# Patient Record
Sex: Female | Born: 1985 | Race: White | Hispanic: No | Marital: Married | Smoking: Never smoker
Health system: Southern US, Community
[De-identification: ages and names within clinical notes are randomized; demographics above are authoritative.]

## PROBLEM LIST (undated history)

## (undated) DIAGNOSIS — Z8619 Personal history of other infectious and parasitic diseases: Secondary | ICD-10-CM

## (undated) DIAGNOSIS — Z789 Other specified health status: Secondary | ICD-10-CM

## (undated) DIAGNOSIS — N390 Urinary tract infection, site not specified: Secondary | ICD-10-CM

## (undated) DIAGNOSIS — A630 Anogenital (venereal) warts: Secondary | ICD-10-CM

## (undated) DIAGNOSIS — O0930 Supervision of pregnancy with insufficient antenatal care, unspecified trimester: Secondary | ICD-10-CM

## (undated) HISTORY — PX: TYMPANOSTOMY TUBE PLACEMENT: SHX32

## (undated) HISTORY — PX: NO PAST SURGERIES: SHX2092

## (undated) HISTORY — PX: MYRINGOTOMY: SUR874

## (undated) HISTORY — DX: Anogenital (venereal) warts: A63.0

## (undated) HISTORY — DX: Supervision of pregnancy with insufficient antenatal care, unspecified trimester: O09.30

## (undated) HISTORY — DX: Personal history of other infectious and parasitic diseases: Z86.19

---

## 2008-01-27 ENCOUNTER — Emergency Department (HOSPITAL_COMMUNITY): Admission: EM | Admit: 2008-01-27 | Discharge: 2008-01-28 | Payer: Self-pay | Admitting: *Deleted

## 2008-10-21 ENCOUNTER — Other Ambulatory Visit: Admission: RE | Admit: 2008-10-21 | Discharge: 2008-10-21 | Payer: Self-pay | Admitting: Obstetrics and Gynecology

## 2009-04-11 ENCOUNTER — Ambulatory Visit: Payer: Self-pay | Admitting: Advanced Practice Midwife

## 2009-04-11 ENCOUNTER — Inpatient Hospital Stay (HOSPITAL_COMMUNITY): Admission: AD | Admit: 2009-04-11 | Discharge: 2009-04-11 | Payer: Self-pay | Admitting: Obstetrics & Gynecology

## 2009-05-07 ENCOUNTER — Inpatient Hospital Stay (HOSPITAL_COMMUNITY): Admission: AD | Admit: 2009-05-07 | Discharge: 2009-05-08 | Payer: Self-pay | Admitting: Obstetrics and Gynecology

## 2009-05-07 ENCOUNTER — Ambulatory Visit: Payer: Self-pay | Admitting: Obstetrics and Gynecology

## 2009-05-17 ENCOUNTER — Inpatient Hospital Stay (HOSPITAL_COMMUNITY): Admission: AD | Admit: 2009-05-17 | Discharge: 2009-05-17 | Payer: Self-pay | Admitting: Family Medicine

## 2009-05-17 ENCOUNTER — Ambulatory Visit: Payer: Self-pay | Admitting: Obstetrics and Gynecology

## 2009-05-18 DIAGNOSIS — K219 Gastro-esophageal reflux disease without esophagitis: Secondary | ICD-10-CM | POA: Insufficient documentation

## 2009-05-20 ENCOUNTER — Inpatient Hospital Stay (HOSPITAL_COMMUNITY): Admission: AD | Admit: 2009-05-20 | Discharge: 2009-05-23 | Payer: Self-pay | Admitting: Obstetrics & Gynecology

## 2009-05-20 ENCOUNTER — Encounter (INDEPENDENT_AMBULATORY_CARE_PROVIDER_SITE_OTHER): Payer: Self-pay | Admitting: *Deleted

## 2009-05-20 ENCOUNTER — Ambulatory Visit: Payer: Self-pay | Admitting: Family

## 2009-05-24 ENCOUNTER — Ambulatory Visit: Payer: Self-pay | Admitting: Advanced Practice Midwife

## 2009-05-24 ENCOUNTER — Inpatient Hospital Stay (HOSPITAL_COMMUNITY): Admission: AD | Admit: 2009-05-24 | Discharge: 2009-05-24 | Payer: Self-pay | Admitting: Obstetrics & Gynecology

## 2010-03-30 NOTE — Letter (Signed)
Summary: Appointment - Missed  [facility] [place]  [address], [place] [address]   Phone: [phone]  Fax: [phone]     May 20, 2009 MRN: [id number]   Jordan Ford [address] [place], [place]  [address]   Dear Ms. MABE,  Our records indicate you missed your appointment on   05/20/09  with Dr.    Diona Browner     It is very important that we reach you to reschedule this appointment. We look forward to participating in your health care needs. Please contact us at the number listed above at your earliest convenience to reschedule this appointment.     Sincerely,    Glass blower/designer

## 2010-05-20 LAB — URINALYSIS, ROUTINE W REFLEX MICROSCOPIC
Bilirubin Urine: NEGATIVE
Glucose, UA: NEGATIVE mg/dL
Hgb urine dipstick: NEGATIVE
Ketones, ur: NEGATIVE mg/dL
Nitrite: NEGATIVE
Protein, ur: NEGATIVE mg/dL
Specific Gravity, Urine: 1.015 (ref 1.005–1.030)
Urobilinogen, UA: 0.2 mg/dL (ref 0.0–1.0)
pH: 6 (ref 5.0–8.0)

## 2010-05-24 LAB — URINALYSIS, ROUTINE W REFLEX MICROSCOPIC
Bilirubin Urine: NEGATIVE
Glucose, UA: NEGATIVE mg/dL
Hgb urine dipstick: NEGATIVE
Ketones, ur: 15 mg/dL — AB
Nitrite: NEGATIVE
Protein, ur: NEGATIVE mg/dL
Specific Gravity, Urine: 1.015 (ref 1.005–1.030)
Urobilinogen, UA: 0.2 mg/dL (ref 0.0–1.0)
pH: 6.5 (ref 5.0–8.0)

## 2010-05-24 LAB — CBC
HCT: 37.9 % (ref 36.0–46.0)
Hemoglobin: 13.1 g/dL (ref 12.0–15.0)
MCHC: 34.7 g/dL (ref 30.0–36.0)
MCV: 90 fL (ref 78.0–100.0)
Platelets: 205 10*3/uL (ref 150–400)
RBC: 4.21 MIL/uL (ref 3.87–5.11)
RDW: 13.8 % (ref 11.5–15.5)
WBC: 19.5 10*3/uL — ABNORMAL HIGH (ref 4.0–10.5)

## 2010-05-24 LAB — RAPID URINE DRUG SCREEN, HOSP PERFORMED
Amphetamines: NOT DETECTED
Barbiturates: NOT DETECTED
Benzodiazepines: NOT DETECTED
Cocaine: NOT DETECTED
Opiates: NOT DETECTED
Tetrahydrocannabinol: NOT DETECTED

## 2010-05-24 LAB — URINE MICROSCOPIC-ADD ON

## 2010-05-24 LAB — RPR: RPR Ser Ql: NONREACTIVE

## 2010-05-24 LAB — TSH: TSH: 2.037 u[IU]/mL (ref 0.350–4.500)

## 2010-11-30 LAB — POCT PREGNANCY, URINE: Preg Test, Ur: NEGATIVE

## 2011-08-29 ENCOUNTER — Emergency Department (HOSPITAL_COMMUNITY)
Admission: EM | Admit: 2011-08-29 | Discharge: 2011-08-29 | Disposition: A | Discharge status: Home / Self Care | Payer: Medicaid Other | Attending: Emergency Medicine | Admitting: Emergency Medicine

## 2011-08-29 ENCOUNTER — Encounter (HOSPITAL_COMMUNITY): Payer: Self-pay | Admitting: *Deleted

## 2011-08-29 DIAGNOSIS — N390 Urinary tract infection, site not specified: Secondary | ICD-10-CM

## 2011-08-29 HISTORY — DX: Urinary tract infection, site not specified: N39.0

## 2011-08-29 LAB — URINE MICROSCOPIC-ADD ON

## 2011-08-29 LAB — URINALYSIS, ROUTINE W REFLEX MICROSCOPIC
Bilirubin Urine: NEGATIVE
Glucose, UA: NEGATIVE mg/dL
Ketones, ur: NEGATIVE mg/dL
Nitrite: NEGATIVE
Protein, ur: NEGATIVE mg/dL
Specific Gravity, Urine: 1.019 (ref 1.005–1.030)
Urobilinogen, UA: 0.2 mg/dL (ref 0.0–1.0)
pH: 6 (ref 5.0–8.0)

## 2011-08-29 LAB — PREGNANCY, URINE: Preg Test, Ur: NEGATIVE

## 2011-08-29 MED ORDER — CIPROFLOXACIN HCL 500 MG PO TABS: 500.0000 mg | ORAL_TABLET | Freq: Two times a day (BID) | ORAL | Status: AC

## 2011-08-29 NOTE — ED Provider Notes (Signed)
History    This chart was scribed for Flint Melter, MD, MD by Smitty Pluck. The patient was seen in room [facility] and the patient's care was started at 4:35PM.   CSN: [id number]  Arrival date & time 08/29/11  1456   None     Chief Complaint  Patient presents with  . Urinary Tract Infection    (Consider location/radiation/quality/duration/timing/severity/associated sxs/prior treatment) The history is provided by the patient.   Jordan Ford is a 26 y.o. female who presents to the Emergency Department complaining of moderate dysuria, lower back pain radiating to abdominal onset 1 week. Pt denies fever and vomiting. Pt reports the last time she had UTI was a couple of years ago. Pt reports symptoms have been constant. Pt reports vaginal discharge.   Past Medical History  Diagnosis Date  . UTI (urinary tract infection)     No past surgical history on file.  No family history on file.  History  Substance Use Topics  . Smoking status: Never Smoker   . Smokeless tobacco: Not on file  . Alcohol Use: No    OB History    Grav Para Term Preterm Abortions TAB SAB Ect Mult Living                  Review of Systems  Constitutional: Negative for fever and chills.  HENT: Negative for congestion and rhinorrhea.   Respiratory: Negative for cough and shortness of breath.   Gastrointestinal: Negative for nausea, vomiting and diarrhea.  Genitourinary: Positive for dysuria. Negative for frequency and pelvic pain.  Musculoskeletal: Positive for back pain.  All other systems reviewed and are negative.    Allergies  Amoxil and Penicillins  Home Medications   Current Outpatient Rx  Name Route Sig Dispense Refill  . DROSPIRENONE-ETHINYL ESTRADIOL 3-0.02 MG PO TABS Oral Take 1 tablet by mouth daily.    Marland Kitchen CIPROFLOXACIN HCL 500 MG PO TABS Oral Take 1 tablet (500 mg total) by mouth 2 (two) times daily. 14 tablet 0    BP 102/65  Pulse 77  Temp 98.1 F (36.7 C) (Oral)  Resp 16   SpO2 100%  LMP 08/29/2011  Physical Exam  Nursing note and vitals reviewed. Constitutional: She is oriented to person, place, and time. She appears well-developed and well-nourished.  HENT:  Head: Normocephalic and atraumatic.  Eyes: Conjunctivae are normal. Pupils are equal, round, and reactive to light.  Neck: Normal range of motion. Neck supple.  Cardiovascular: Normal rate, regular rhythm and normal heart sounds.   Pulmonary/Chest: Effort normal. No respiratory distress.  Abdominal: Soft. She exhibits no distension. There is no tenderness.  Genitourinary:       No costovertebral angle tenderness   Musculoskeletal: She exhibits no tenderness.  Neurological: She is alert and oriented to person, place, and time.  Skin: Skin is warm and dry.  Psychiatric: She has a normal mood and affect. Her behavior is normal.    ED Course  Procedures (including critical care time) DIAGNOSTIC STUDIES: Oxygen Saturation is 100% on room air, normal by my interpretation.    COORDINATION OF CARE: 4:44PM EDP discusses pt ED treatment with pt.  6:13PM EDP rechecks pt and discusses pt lab results.  Labs Reviewed  URINALYSIS, ROUTINE W REFLEX MICROSCOPIC - Abnormal; Notable for the following:    Hgb urine dipstick MODERATE (*)     Leukocytes, UA TRACE (*)     All other components within normal limits  URINE MICROSCOPIC-ADD ON - Abnormal; Notable  for the following:    Bacteria, UA FEW (*)     All other components within normal limits  PREGNANCY, URINE  URINE CULTURE   No results found.   1. UTI (lower urinary tract infection)       MDM  Duyuria and flank pain in non-toxic pt. No clear UTI on testing, culture sent. Doubt metabolic instability, serious bacterial infection or impending vascular collapse; the patient is stable for discharge.  Plan: Home Medications- Cipro; Home Treatments- rest and fluids; Recommended follow up- PCP of choice prn  I personally performed the services  described in this documentation, which was scribed in my presence. The recorded information has been reviewed and considered.         Flint Melter, MD 08/30/11 1021

## 2011-08-29 NOTE — ED Notes (Addendum)
Pt reports burning w/urination and (L) lower back pain x1 week. Pt denies N/V/D or abnormal vaginal odor or discharge. Pt denies dysuria or frequency

## 2011-08-29 NOTE — ED Notes (Signed)
Feels like she has an uti. Hx of uti with similar symptoms. Burning on urination.

## 2011-08-29 NOTE — Discharge Instructions (Signed)
There was no clear evidence for a urinary infection.  The culture will return in 2 days and will likely show an infection, if you actually have one. It is important to see a doctor if you are not better in 7-10 days.   Urinary Tract Infection Infections of the urinary tract can start in several places. A bladder infection (cystitis), a kidney infection (pyelonephritis), and a prostate infection (prostatitis) are different types of urinary tract infections (UTIs). They usually get better if treated with medicines (antibiotics) that kill germs. Take all the medicine until it is gone. You or your child may feel better in a few days, but TAKE ALL MEDICINE or the infection may not respond and may become more difficult to treat. HOME CARE INSTRUCTIONS   Drink enough water and fluids to keep the urine clear or pale yellow. Cranberry juice is especially recommended, in addition to large amounts of water.   Avoid caffeine, tea, and carbonated beverages. They tend to irritate the bladder.   Alcohol may irritate the prostate.   Only take over-the-counter or prescription medicines for pain, discomfort, or fever as directed by your caregiver.  To prevent further infections:  Empty the bladder often. Avoid holding urine for long periods of time.   After a bowel movement, women should cleanse from front to back. Use each tissue only once.   Empty the bladder before and after sexual intercourse.  FINDING OUT THE RESULTS OF YOUR TEST Not all test results are available during your visit. If your or your child's test results are not back during the visit, make an appointment with your caregiver to find out the results. Do not assume everything is normal if you have not heard from your caregiver or the medical facility. It is important for you to follow up on all test results. SEEK MEDICAL CARE IF:   There is back pain.   Your baby is older than 3 months with a rectal temperature of 100.5 F (38.1 C) or  higher for more than 1 day.   Your or your child's problems (symptoms) are no better in 3 days. Return sooner if you or your child is getting worse.  SEEK IMMEDIATE MEDICAL CARE IF:   There is severe back pain or lower abdominal pain.   You or your child develops chills.   You have a fever.   Your baby is older than 3 months with a rectal temperature of 102 F (38.9 C) or higher.   Your baby is 42 months old or younger with a rectal temperature of 100.4 F (38 C) or higher.   There is nausea or vomiting.   There is continued burning or discomfort with urination.  MAKE SURE YOU:   Understand these instructions.   Will watch your condition.   Will get help right away if you are not doing well or get worse.  Document Released: 11/24/2004 Document Revised: 02/03/2011 Document Reviewed: 06/29/2006 [facility] Patient Information 2012 [place], [place].  RESOURCE GUIDE  Chronic Pain Problems: Contact [facility]  [phone] Patients need to be referred by their primary care doctor.  Insufficient Money for Medicine: Contact United Way:  call "211" or [facility] (669)833-4255.  No Primary Care Doctor: - Call Health Connect  [phone] - can help you locate a primary care doctor that  accepts your insurance, provides certain services, etc. - Physician Referral [phone]  Agencies that provide inexpensive medical care: - [facility]  [phone] - [facility]  [phone] - [facility]  [phone] - Women's Clinic  [phone] - Planned Parenthood  [phone] [facility]  [phone]  Medicaid-accepting [facility] Providers: - [facility]- [address]  [phone], Mon-Fri 9am-7pm, Sat 9am-1pm - [facility]- [address] [place], [address] [place]  [phone] - [facility]- [address], [address] [place]  [phone] [facility]- [address]  [phone] - Renaye Rakers- [address] [place], [address], [phone]  Only accepts [place] patients after they have their name  applied to their card  Self Pay (no insurance) in [place]: - Sickle Cell Patients: Dr Willey Blade, [facility]  [address] [place], [phone] - [facility]- [address] [place]  [phone]       [facility] [place]- [address]       -     [facility]- see information above (Speak to [employer] if you do not have insurance)       -  [facility]- [address] [place], [phone]       -  [facility]- [address],  [phone]       -  [facility]- [address] [phone]       -  Dr Julio Sicks-  [address], [place], [phone]       -  [facility]gent Care- [address] [phone]       -  [facility]- [address], [phone], also [address] [phone]       -    [facility]- [address] [place], [phone], 1st & 3rd Saturday   every month, 10am-1pm  1) Find a Doctor and Pay Out of Pocket Although you won't have to find out who is covered by your insurance plan, it is a good idea to ask around and get recommendations. You will then need to call the office and see if the doctor you have chosen will accept you as a new patient and what types of options they offer for patients who are self-pay. Some doctors offer discounts or will set up payment plans for their patients who do not have insurance, but you will need to ask so you aren't surprised when you get to your appointment.  2) Contact Your Local Health Department Not all health departments have doctors that can see patients for sick visits, but many do, so it is worth a call to see if yours does. If you don't know where your local health department is, you can check in your phone book. The CDC  also has a tool to help you locate your state's health department, and many state websites also have listings of all of their local health departments.  3) Find a Walk-in Clinic If your illness is not likely to be very severe or complicated, you may want to try a walk in clinic. These are popping up all over the country in pharmacies, drugstores, and shopping centers. They're usually staffed by nurse practitioners or physician assistants that have been trained to treat common illnesses and complaints. They're usually fairly quick and inexpensive. However, if you have serious medical issues or chronic medical problems, these are probably not  your best option  STD Testing - [facility] Department of [facility] [place], STD Clinic, [address], [place], phone [phone] or [phone].  Monday - Friday, call for an appointment. [facility] Department of [employer], STD Clinic, [address], [place], phone [phone] or [phone].  Monday - Friday, call for an appointment.  Abuse/Neglect: [facility] Child Abuse Hotline 4131039190 [facility] Child Abuse Hotline 774-760-1127 (After Hours)  Emergency Shelter:  [facility] (854)289-9804  Maternity Homes: - [facility] (220)545-7521 - [facility] (331) [phone]  MRSA Hotline #:   718-299-1943  [facility] Resources  [facility]  United Way [facility] Dept. [address]                 [address]         [address] [place] [address]  Blondell Reveal Phone:  [phone]                                  Phone:  [phone]                   Phone:  [phone]  [facility] Mental Health, [phone] - [facility] - [phone]       -     [facility]  in [place], [address]                                  [phone], [facility] Child Abuse Hotline 262-619-1573 or 810-046-5248 (After Hours)   Behavioral Health Services  Substance Abuse Resources: - [facility] Drug Services  716-254-1698 - [facility] [phone] - [facility] [place] [phone] Floydene Flock [phone] - Residential & Outpatient Substance Abuse Program  [phone]  Psychological Services: [facility]  [phone] Services  [phone] - [facility], [phone] [place]. [address] [place], ACCESS LINE: 334-773-6605 or (442)268-5759, [employer]za  Dental Assistance  If unable to pay or uninsured, contact:  [facility] or [facility]. to become qualified for the adult dental clinic.  Patients with Medicaid: [facility] [phone], [phone] [address] [phone]  If unable to pay, or uninsured, contact [facility] (772)269-8013) or [facility] Department [phone])[phone] in [place], [phone] in [facility]) to become qualified for the adult dental clinic  Other Low-Cost Community Dental Services: - [facility]- [address] [place], [place], [place], [address], [phone], Ext. [address], 2nd and 4th Thursday of the month at 6:30am.  10 clients each day by appointment, can sometimes see walk-in patients if someone does not show for an appointment. [facility]- [address] [place], [place], [address], [phone] -  [facility]- [address], [place], [place], [address], [phone] - [place] [facility]- [phone] [facility] Health Department- [phone] [facility] Department- [phone]

## 2011-08-31 LAB — URINE CULTURE
Colony Count: NO GROWTH
Culture: NO GROWTH

## 2012-02-29 NOTE — L&D Delivery Note (Signed)
Pt progressed along a normal labor curve. She had some varible decels in the later part of the first stage. She pushed for one half hour and had a SVD of one live viable white female infant over a second degree midline tear in the ROA position. Placenta S/I. 3 vessel cord. [facility]. Tear closed with 30 chromic.

## 2012-04-09 LAB — OB RESULTS CONSOLE HIV ANTIBODY (ROUTINE TESTING): HIV: NONREACTIVE

## 2012-04-09 LAB — OB RESULTS CONSOLE ABO/RH: RH Type: NEGATIVE

## 2012-04-09 LAB — OB RESULTS CONSOLE RPR: RPR: NONREACTIVE

## 2012-04-09 LAB — OB RESULTS CONSOLE RUBELLA ANTIBODY, IGM: Rubella: IMMUNE

## 2012-04-09 LAB — OB RESULTS CONSOLE ANTIBODY SCREEN: Antibody Screen: NEGATIVE

## 2012-04-09 LAB — OB RESULTS CONSOLE HEPATITIS B SURFACE ANTIGEN: Hepatitis B Surface Ag: NEGATIVE

## 2012-04-09 LAB — OB RESULTS CONSOLE GC/CHLAMYDIA
Chlamydia: NEGATIVE
Gonorrhea: NEGATIVE

## 2012-08-17 LAB — OB RESULTS CONSOLE GBS: GBS: NEGATIVE

## 2012-08-25 ENCOUNTER — Encounter (HOSPITAL_COMMUNITY): Payer: Self-pay

## 2012-08-25 ENCOUNTER — Inpatient Hospital Stay (HOSPITAL_COMMUNITY)
Admission: AD | Admit: 2012-08-25 | Discharge: 2012-08-25 | Disposition: A | Discharge status: Home / Self Care | Payer: Medicaid Other | Source: Ambulatory Visit | Attending: Obstetrics and Gynecology | Admitting: Obstetrics and Gynecology

## 2012-08-25 DIAGNOSIS — O479 False labor, unspecified: Secondary | ICD-10-CM | POA: Insufficient documentation

## 2012-08-25 NOTE — MAU Note (Signed)
Patient is in with c/o ctx q3+4 minutes for 5 hours. She denies vaginal bleeding or lof. She reports good fetal movement

## 2012-09-06 ENCOUNTER — Telehealth (HOSPITAL_COMMUNITY): Payer: Self-pay | Admitting: *Deleted

## 2012-09-06 ENCOUNTER — Inpatient Hospital Stay (HOSPITAL_COMMUNITY)
Admission: AD | Admit: 2012-09-06 | Discharge: 2012-09-09 | DRG: 775 | Disposition: A | Discharge status: Home / Self Care | Payer: Medicaid Other | Source: Ambulatory Visit | Attending: Obstetrics & Gynecology | Admitting: Obstetrics & Gynecology

## 2012-09-06 ENCOUNTER — Encounter (HOSPITAL_COMMUNITY): Payer: Self-pay | Admitting: *Deleted

## 2012-09-06 DIAGNOSIS — Z3480 Encounter for supervision of other normal pregnancy, unspecified trimester: Secondary | ICD-10-CM

## 2012-09-06 DIAGNOSIS — K219 Gastro-esophageal reflux disease without esophagitis: Secondary | ICD-10-CM

## 2012-09-06 HISTORY — DX: Other specified health status: Z78.9

## 2012-09-06 NOTE — MAU Note (Signed)
Contractions, back pain

## 2012-09-06 NOTE — Telephone Encounter (Signed)
Preadmission screen  

## 2012-09-07 ENCOUNTER — Inpatient Hospital Stay (HOSPITAL_COMMUNITY): Payer: Medicaid Other | Admitting: Anesthesiology

## 2012-09-07 ENCOUNTER — Encounter (HOSPITAL_COMMUNITY): Payer: Self-pay | Admitting: *Deleted

## 2012-09-07 ENCOUNTER — Encounter (HOSPITAL_COMMUNITY): Payer: Self-pay | Admitting: Anesthesiology

## 2012-09-07 LAB — CBC
HCT: 33.1 % — ABNORMAL LOW (ref 36.0–46.0)
Hemoglobin: 11.8 g/dL — ABNORMAL LOW (ref 12.0–15.0)
MCH: 30.4 pg (ref 26.0–34.0)
MCHC: 35.6 g/dL (ref 30.0–36.0)
MCV: 85.3 fL (ref 78.0–100.0)
Platelets: 184 10*3/uL (ref 150–400)
RBC: 3.88 MIL/uL (ref 3.87–5.11)
RDW: 14.1 % (ref 11.5–15.5)
WBC: 16.2 10*3/uL — ABNORMAL HIGH (ref 4.0–10.5)

## 2012-09-07 LAB — ABO/RH: ABO/RH(D): O NEG

## 2012-09-07 LAB — RPR: RPR Ser Ql: NONREACTIVE

## 2012-09-07 MED ORDER — CITRIC ACID-SODIUM CITRATE 334-500 MG/5ML PO SOLN: 30.0000 mL | ORAL | Status: DC | PRN

## 2012-09-07 MED ORDER — LIDOCAINE HCL (PF) 1 % IJ SOLN
INTRAMUSCULAR | Status: DC | PRN
  Administered 2012-09-07 (×2): 4 mL

## 2012-09-07 MED ORDER — DIBUCAINE 1 % RE OINT: 1.0000 "application " | TOPICAL_OINTMENT | RECTAL | Status: DC | PRN

## 2012-09-07 MED ORDER — MEASLES, MUMPS & RUBELLA VAC ~~LOC~~ INJ
0.5000 mL | INJECTION | Freq: Once | SUBCUTANEOUS | Status: DC
  Filled 2012-09-07: qty 0.5

## 2012-09-07 MED ORDER — OXYTOCIN 40 UNITS IN LACTATED RINGERS INFUSION - SIMPLE MED
62.5000 mL/h | INTRAVENOUS | Status: DC
  Administered 2012-09-07: 62.5 mL/h via INTRAVENOUS
  Administered 2012-09-07: 999 mL/h via INTRAVENOUS

## 2012-09-07 MED ORDER — ONDANSETRON HCL 4 MG/2ML IJ SOLN: 4.0000 mg | Freq: Four times a day (QID) | INTRAMUSCULAR | Status: DC | PRN

## 2012-09-07 MED ORDER — IBUPROFEN 600 MG PO TABS: 600.0000 mg | ORAL_TABLET | Freq: Four times a day (QID) | ORAL | Status: DC | PRN

## 2012-09-07 MED ORDER — OXYTOCIN 40 UNITS IN LACTATED RINGERS INFUSION - SIMPLE MED
1.0000 m[IU]/min | INTRAVENOUS | Status: DC
  Administered 2012-09-07: 4 m[IU]/min via INTRAVENOUS
  Administered 2012-09-07: 2 m[IU]/min via INTRAVENOUS
  Filled 2012-09-07: qty 1000

## 2012-09-07 MED ORDER — SIMETHICONE 80 MG PO CHEW: 80.0000 mg | CHEWABLE_TABLET | ORAL | Status: DC | PRN

## 2012-09-07 MED ORDER — ONDANSETRON HCL 4 MG/2ML IJ SOLN: 4.0000 mg | INTRAMUSCULAR | Status: DC | PRN

## 2012-09-07 MED ORDER — ONDANSETRON HCL 4 MG PO TABS: 4.0000 mg | ORAL_TABLET | ORAL | Status: DC | PRN

## 2012-09-07 MED ORDER — OXYTOCIN BOLUS FROM INFUSION: 500.0000 mL | INTRAVENOUS | Status: DC

## 2012-09-07 MED ORDER — BENZOCAINE-MENTHOL 20-0.5 % EX AERO
1.0000 "application " | INHALATION_SPRAY | CUTANEOUS | Status: DC | PRN
  Administered 2012-09-07: 1 via TOPICAL
  Filled 2012-09-07: qty 56

## 2012-09-07 MED ORDER — OXYCODONE-ACETAMINOPHEN 5-325 MG PO TABS
1.0000 | ORAL_TABLET | ORAL | Status: DC | PRN
  Administered 2012-09-08 (×3): 1 via ORAL
  Administered 2012-09-09: 0.5 via ORAL
  Filled 2012-09-07 (×4): qty 1

## 2012-09-07 MED ORDER — WITCH HAZEL-GLYCERIN EX PADS: 1.0000 "application " | MEDICATED_PAD | CUTANEOUS | Status: DC | PRN

## 2012-09-07 MED ORDER — PHENYLEPHRINE 40 MCG/ML (10ML) SYRINGE FOR IV PUSH (FOR BLOOD PRESSURE SUPPORT)
80.0000 ug | PREFILLED_SYRINGE | INTRAVENOUS | Status: DC | PRN
  Filled 2012-09-07: qty 5

## 2012-09-07 MED ORDER — LIDOCAINE HCL (PF) 1 % IJ SOLN
30.0000 mL | INTRAMUSCULAR | Status: DC | PRN
  Filled 2012-09-07: qty 30

## 2012-09-07 MED ORDER — OXYCODONE-ACETAMINOPHEN 5-325 MG PO TABS: 1.0000 | ORAL_TABLET | ORAL | Status: DC | PRN

## 2012-09-07 MED ORDER — ACETAMINOPHEN 325 MG PO TABS: 650.0000 mg | ORAL_TABLET | ORAL | Status: DC | PRN

## 2012-09-07 MED ORDER — IBUPROFEN 600 MG PO TABS
600.0000 mg | ORAL_TABLET | Freq: Four times a day (QID) | ORAL | Status: DC
  Administered 2012-09-07 – 2012-09-09 (×8): 600 mg via ORAL
  Filled 2012-09-07 (×8): qty 1

## 2012-09-07 MED ORDER — FENTANYL 2.5 MCG/ML BUPIVACAINE 1/10 % EPIDURAL INFUSION (WH - ANES)
14.0000 mL/h | INTRAMUSCULAR | Status: DC | PRN
  Administered 2012-09-07: 14 mL/h via EPIDURAL
  Filled 2012-09-07 (×2): qty 125

## 2012-09-07 MED ORDER — PHENYLEPHRINE 40 MCG/ML (10ML) SYRINGE FOR IV PUSH (FOR BLOOD PRESSURE SUPPORT): 80.0000 ug | PREFILLED_SYRINGE | INTRAVENOUS | Status: DC | PRN

## 2012-09-07 MED ORDER — LACTATED RINGERS IV SOLN
500.0000 mL | INTRAVENOUS | Status: DC | PRN
  Administered 2012-09-07: 1000 mL via INTRAVENOUS

## 2012-09-07 MED ORDER — FENTANYL 2.5 MCG/ML BUPIVACAINE 1/10 % EPIDURAL INFUSION (WH - ANES)
INTRAMUSCULAR | Status: DC | PRN
  Administered 2012-09-07: 14 mL/h via EPIDURAL

## 2012-09-07 MED ORDER — LACTATED RINGERS IV SOLN
500.0000 mL | Freq: Once | INTRAVENOUS | Status: AC
  Administered 2012-09-07: 500 mL via INTRAVENOUS

## 2012-09-07 MED ORDER — FLEET ENEMA 7-19 GM/118ML RE ENEM: 1.0000 | ENEMA | RECTAL | Status: DC | PRN

## 2012-09-07 MED ORDER — TERBUTALINE SULFATE 1 MG/ML IJ SOLN: 0.2500 mg | Freq: Once | INTRAMUSCULAR | Status: DC | PRN

## 2012-09-07 MED ORDER — TETANUS-DIPHTH-ACELL PERTUSSIS 5-2.5-18.5 LF-MCG/0.5 IM SUSP
0.5000 mL | Freq: Once | INTRAMUSCULAR | Status: AC
  Administered 2012-09-08: 0.5 mL via INTRAMUSCULAR

## 2012-09-07 MED ORDER — DIPHENHYDRAMINE HCL 50 MG/ML IJ SOLN: 12.5000 mg | INTRAMUSCULAR | Status: DC | PRN

## 2012-09-07 MED ORDER — EPHEDRINE 5 MG/ML INJ
10.0000 mg | INTRAVENOUS | Status: DC | PRN
  Administered 2012-09-07: 10 mg via INTRAVENOUS

## 2012-09-07 MED ORDER — ZOLPIDEM TARTRATE 5 MG PO TABS: 5.0000 mg | ORAL_TABLET | Freq: Every evening | ORAL | Status: DC | PRN

## 2012-09-07 MED ORDER — LACTATED RINGERS IV SOLN
INTRAVENOUS | Status: DC
  Administered 2012-09-07 (×2): via INTRAVENOUS

## 2012-09-07 MED ORDER — EPHEDRINE 5 MG/ML INJ
10.0000 mg | INTRAVENOUS | Status: DC | PRN
  Filled 2012-09-07: qty 4

## 2012-09-07 NOTE — H&P (Signed)
Pt is a 27 yr old white female, G2P1001 at term who presented to the ER c/o labor. She was reported to have changed her cx while in the ER. Pt had late [facility]. She is Rh - and did receive Rhogam. PMHx: see [place] PE: VSSAF         HEENT-wnl         Abd- gravid, non tender         Cx-20/3/-3 AROM-clear. IMP/ IUP at term          Late [facility] PLAN/ Admit

## 2012-09-07 NOTE — Anesthesia Procedure Notes (Signed)
Epidural Patient location during procedure: OB Start time: 09/07/2012 4:54 AM  Staffing Anesthesiologist: Ahonesty Woodfin A. Performed by: anesthesiologist   Preanesthetic Checklist Completed: patient identified, site marked, surgical consent, pre-op evaluation, timeout performed, IV checked, risks and benefits discussed and monitors and equipment checked  Epidural Patient position: sitting Prep: site prepped and draped and DuraPrep Patient monitoring: continuous pulse ox and blood pressure Approach: midline Injection technique: LOR air  Needle:  Needle type: Tuohy  Needle gauge: 17 G Needle length: 9 cm and 9 Needle insertion depth: 4 cm Catheter type: closed end flexible Catheter size: 19 Gauge Catheter at skin depth: 9 cm Test dose: negative and Other  Assessment Events: blood not aspirated, injection not painful, no injection resistance, negative IV test and no paresthesia  Additional Notes Patient identified. Risks and benefits discussed including failed block, incomplete  Pain control, post dural puncture headache, nerve damage, paralysis, blood pressure Changes, nausea, vomiting, reactions to medications-both toxic and allergic and post Partum back pain. All questions were answered. Patient expressed understanding and wished to proceed. Sterile technique was used throughout procedure. Epidural site was Dressed with sterile barrier dressing. No paresthesias, signs of intravascular injection Or signs of intrathecal spread were encountered.  Patient was more comfortable after the epidural was dosed. Please see RN's note for documentation of vital signs and FHR which are stable.

## 2012-09-07 NOTE — Anesthesia Preprocedure Evaluation (Signed)
Anesthesia Evaluation  Patient identified by MRN, date of birth, ID band Patient awake    Reviewed: Allergy & Precautions, H&P , Patient's Chart, lab work & pertinent test results  Airway Mallampati: II  TM Distance: >3 FB Neck ROM: full    Dental no notable dental hx. (+) Teeth Intact   Pulmonary neg pulmonary ROS,  breath sounds clear to auscultation  Pulmonary exam normal       Cardiovascular negative cardio ROS  Rhythm:regular Rate:Normal     Neuro/Psych negative neurological ROS  negative psych ROS   GI/Hepatic Neg liver ROS, GERD-  Medicated and Controlled,  Endo/Other  negative endocrine ROS  Renal/GU negative Renal ROS  negative genitourinary   Musculoskeletal   Abdominal Normal abdominal exam  (+)   Peds  Hematology negative hematology ROS (+)   Anesthesia Other Findings   Reproductive/Obstetrics (+) Pregnancy                             Anesthesia Physical Anesthesia Plan  ASA: II  Anesthesia Plan: Epidural   Post-op Pain Management:    Induction:   Airway Management Planned:   Additional Equipment:   Intra-op Plan:   Post-operative Plan:   Informed Consent: I have reviewed the patients History and Physical, chart, labs and discussed the procedure including the risks, benefits and alternatives for the proposed anesthesia with the patient or authorized representative who has indicated his/her understanding and acceptance.     Plan Discussed with: Anesthesiologist  Anesthesia Plan Comments:         Anesthesia Quick Evaluation  

## 2012-09-07 NOTE — MAU Note (Signed)
Pt states she started having contractions 09/06/12 at 2000

## 2012-09-08 LAB — CBC
HCT: 31.4 % — ABNORMAL LOW (ref 36.0–46.0)
Hemoglobin: 10.9 g/dL — ABNORMAL LOW (ref 12.0–15.0)
MCH: 29.9 pg (ref 26.0–34.0)
MCHC: 34.7 g/dL (ref 30.0–36.0)
MCV: 86.3 fL (ref 78.0–100.0)
Platelets: 179 10*3/uL (ref 150–400)
RBC: 3.64 MIL/uL — ABNORMAL LOW (ref 3.87–5.11)
RDW: 14.1 % (ref 11.5–15.5)
WBC: 17.2 10*3/uL — ABNORMAL HIGH (ref 4.0–10.5)

## 2012-09-08 MED ORDER — SENNOSIDES-DOCUSATE SODIUM 8.6-50 MG PO TABS
2.0000 | ORAL_TABLET | Freq: Every evening | ORAL | Status: DC | PRN
  Administered 2012-09-08: 2 via ORAL

## 2012-09-08 NOTE — Anesthesia Postprocedure Evaluation (Signed)
Anesthesia Post Note  Patient: Jordan Ford  Procedure(s) Performed: * No procedures listed *  Anesthesia type: Epidural  Patient location: Mother/Baby  Post pain: Pain level controlled  Post assessment: Post-op Vital signs reviewed  Last Vitals:  Filed Vitals:   09/08/12 0622  BP: 96/67  Pulse: 101  Temp: 36.5 C  Resp: 19    Post vital signs: Reviewed  Level of consciousness: awake  Complications: No apparent anesthesia complications

## 2012-09-08 NOTE — Progress Notes (Signed)
CSW received consult from CN today for late/ltd PNC.  Upon chart review, it appears MOB started PNC at 19 weeks, which does not meet hospital policy for CSW consult for LPNC.  There are least 5 visits documented and, therefore, this does not meet hospital policy criteria for ltd.  CSW is screening out referral at this time, but will meet with MOB by her request or if concerns arise. 

## 2012-09-08 NOTE — Progress Notes (Signed)
PPD#1 Pt without complaints. Lochia-moderate VSSAF PLAN/ routine care.

## 2012-09-08 NOTE — Progress Notes (Signed)
Patient requesting stool softener.  Dr Dareen Piano called and medication ordered

## 2012-09-09 NOTE — Progress Notes (Signed)
PPD#2 Pt doing well. Lochia-mild IMP/doing well PLAN/ discharge.

## 2012-09-09 NOTE — Discharge Summary (Signed)
Obstetric Discharge Summary Reason for Admission: onset of labor Prenatal Procedures: ultrasound Intrapartum Procedures: spontaneous vaginal delivery Postpartum Procedures: none Complications-Operative and Postpartum: 2nd degree perineal laceration Hemoglobin  Date Value Range Status  09/08/2012 10.9* 12.0 - 15.0 g/dL Final     HCT  Date Value Range Status  09/08/2012 31.4* 36.0 - 46.0 % Final    Physical Exam:  General: alert Lochia: appropriate Uterine Fundus: firm   Discharge Diagnoses: Term Pregnancy-delivered  Discharge Information: Date: 09/09/2012 Activity: pelvic rest Diet: routine Medications: PNV and Ibuprofen Condition: stable Instructions: refer to practice specific booklet Discharge to: home Follow-up Information   Follow up with Levi Aland, MD. Schedule an appointment as soon as possible for a visit in 4 weeks.   Contact information:   [address] [place] [address] [phone]       Newborn Data: Live born female  Birth Weight: 7 lb 5.3 oz (3325 g) APGAR: 9, 9  Home with mother.  Jordan Ford 09/09/2012, 10:26 AM

## 2012-09-10 NOTE — Progress Notes (Signed)
Post discharge chart review completed.  

## 2012-09-13 ENCOUNTER — Inpatient Hospital Stay (HOSPITAL_COMMUNITY): Admission: RE | Admit: 2012-09-13 | Payer: Medicaid Other | Source: Ambulatory Visit

## 2013-02-27 ENCOUNTER — Emergency Department (HOSPITAL_COMMUNITY)
Admission: EM | Admit: 2013-02-27 | Discharge: 2013-02-27 | Disposition: A | Discharge status: Home / Self Care | Payer: Medicaid Other | Attending: Emergency Medicine | Admitting: Emergency Medicine

## 2013-02-27 ENCOUNTER — Encounter (HOSPITAL_COMMUNITY): Payer: Self-pay | Admitting: Emergency Medicine

## 2013-02-27 DIAGNOSIS — Z880 Allergy status to penicillin: Secondary | ICD-10-CM | POA: Insufficient documentation

## 2013-02-27 DIAGNOSIS — R0981 Nasal congestion: Secondary | ICD-10-CM

## 2013-02-27 DIAGNOSIS — M791 Myalgia, unspecified site: Secondary | ICD-10-CM

## 2013-02-27 DIAGNOSIS — IMO0001 Reserved for inherently not codable concepts without codable children: Secondary | ICD-10-CM | POA: Insufficient documentation

## 2013-02-27 DIAGNOSIS — Z87440 Personal history of urinary (tract) infections: Secondary | ICD-10-CM | POA: Insufficient documentation

## 2013-02-27 DIAGNOSIS — Z8619 Personal history of other infectious and parasitic diseases: Secondary | ICD-10-CM | POA: Insufficient documentation

## 2013-02-27 DIAGNOSIS — J3489 Other specified disorders of nose and nasal sinuses: Secondary | ICD-10-CM | POA: Insufficient documentation

## 2013-02-27 DIAGNOSIS — R51 Headache: Secondary | ICD-10-CM | POA: Insufficient documentation

## 2013-02-27 DIAGNOSIS — R6883 Chills (without fever): Secondary | ICD-10-CM | POA: Insufficient documentation

## 2013-02-27 MED ORDER — IBUPROFEN 400 MG PO TABS
600.0000 mg | ORAL_TABLET | Freq: Once | ORAL | Status: AC
  Administered 2013-02-27: 600 mg via ORAL
  Filled 2013-02-27 (×2): qty 1

## 2013-02-27 NOTE — ED Provider Notes (Signed)
CSN: [id number]     Arrival date & time 02/27/13  0335 History   First MD Initiated Contact with Patient 02/27/13 [phone]     Chief Complaint  Patient presents with  . Generalized Body Aches    The history is provided by the patient.  pt reports she has had myalgias, chills, nasal congestion, post nasal drip and mild HA for past day No fever but reports chills No significant cough No vomiting Her course is worsening Ibuprofen improves her symptoms She denies cp/sob/abd pain   Past Medical History  Diagnosis Date  . UTI (urinary tract infection)   . Condyloma   . Hx of varicella   . Late prenatal care   . Medical history non-contributory    Past Surgical History  Procedure Laterality Date  . No past surgeries     Family History  Problem Relation Age of Onset  . Hypertension Father   . Mental retardation Maternal Aunt     downs  . Diabetes Maternal Grandmother   . Heart disease Maternal Grandfather   . Cancer Paternal Grandmother     breast   History  Substance Use Topics  . Smoking status: Never Smoker   . Smokeless tobacco: Not on file  . Alcohol Use: No   OB History   Grav Para Term Preterm Abortions TAB SAB Ect Mult Living   2 2 2       2      Review of Systems  Constitutional: Positive for chills.  Gastrointestinal: Negative for vomiting.    Allergies  Amoxil and Penicillins  Home Medications  No current outpatient prescriptions on file. BP 95/60  Pulse 109  Temp(Src) 98.9 F (37.2 C) (Oral)  Resp 16  SpO2 100%  LMP 01/25/2013  Breastfeeding? No Physical Exam CONSTITUTIONAL: Well developed/well nourished HEAD: Normocephalic/atraumatic EYES: EOMI/PERRL ENMT: Mucous membranes moist, uvula midline, pharynx non-erythematous, nasal congestion NECK: supple no meningeal signs CV: S1/S2 noted, no murmurs/rubs/gallops noted LUNGS: Lungs are clear to auscultation bilaterally, no apparent distress ABDOMEN: soft, nontender, no rebound or  guarding NEURO: Pt is awake/alert, moves all extremitiesx4 EXTREMITIES: pulses normal, full ROM SKIN: warm, color normal PSYCH: no abnormalities of mood noted  ED Course  Procedures (including critical care time) Labs Review Labs Reviewed - No data to display Imaging Review No results found.  EKG Interpretation   None       MDM   1. Myalgia   2. Nasal congestion    Nursing notes including past medical history and social history reviewed and considered in documentation  Suspect viral syndrome.  She is well appearing.  Advised use of ibuprofen/fluids and rest Pt agreeable with plan     Joya Gaskins, MD 02/27/13 [phone]

## 2013-02-27 NOTE — ED Notes (Signed)
Pt states that she has been having body aches and chills since yesterday afternoon. States that her son has pneumonia. States that she did not take her temperature.

## 2013-12-30 ENCOUNTER — Encounter (HOSPITAL_COMMUNITY): Payer: Self-pay | Admitting: Emergency Medicine

## 2018-02-28 NOTE — L&D Delivery Note (Signed)
Patient was C/C/) and pushed for 10 minutes with epidural.    NSVD  female infant, Apgars 8,9, weight P.   The patient had a midline second degree perineal and a R labial cross sectional lacerations repaired with 2- and 3- vicryl R respectively. Fundus was firm. EBL was expected amount. Placenta was delivered intact. Vagina was clear.  Delayed cord clamping done for 30-60 seconds while warming baby. Baby was vigorous and doing skin to skin with mother.  Jordan Ford

## 2018-03-29 LAB — OB RESULTS CONSOLE GC/CHLAMYDIA
Chlamydia: NEGATIVE
Gonorrhea: NEGATIVE

## 2018-04-12 LAB — OB RESULTS CONSOLE HIV ANTIBODY (ROUTINE TESTING): HIV: NONREACTIVE

## 2018-04-12 LAB — OB RESULTS CONSOLE HEPATITIS B SURFACE ANTIGEN: Hepatitis B Surface Ag: NEGATIVE

## 2018-04-12 LAB — OB RESULTS CONSOLE RUBELLA ANTIBODY, IGM: Rubella: IMMUNE

## 2018-04-12 LAB — OB RESULTS CONSOLE RPR: RPR: NONREACTIVE

## 2018-08-03 ENCOUNTER — Inpatient Hospital Stay (HOSPITAL_COMMUNITY): Payer: Medicaid Other

## 2018-08-03 ENCOUNTER — Encounter (HOSPITAL_COMMUNITY): Payer: Self-pay | Admitting: *Deleted

## 2018-08-03 ENCOUNTER — Inpatient Hospital Stay (HOSPITAL_COMMUNITY)
Admission: AD | Admit: 2018-08-03 | Discharge: 2018-08-06 | DRG: 833 | Disposition: A | Discharge status: Home / Self Care | Payer: Medicaid Other | Attending: Obstetrics and Gynecology | Admitting: Obstetrics and Gynecology

## 2018-08-03 ENCOUNTER — Other Ambulatory Visit: Payer: Self-pay

## 2018-08-03 DIAGNOSIS — Z3A28 28 weeks gestation of pregnancy: Secondary | ICD-10-CM

## 2018-08-03 DIAGNOSIS — O4693 Antepartum hemorrhage, unspecified, third trimester: Secondary | ICD-10-CM

## 2018-08-03 DIAGNOSIS — O4593 Premature separation of placenta, unspecified, third trimester: Secondary | ICD-10-CM | POA: Diagnosis not present

## 2018-08-03 DIAGNOSIS — Z3A29 29 weeks gestation of pregnancy: Secondary | ICD-10-CM | POA: Diagnosis not present

## 2018-08-03 DIAGNOSIS — Z1159 Encounter for screening for other viral diseases: Secondary | ICD-10-CM | POA: Diagnosis not present

## 2018-08-03 DIAGNOSIS — O4590 Premature separation of placenta, unspecified, unspecified trimester: Secondary | ICD-10-CM | POA: Diagnosis present

## 2018-08-03 LAB — COMPREHENSIVE METABOLIC PANEL
ALT: 16 U/L (ref 0–44)
AST: 19 U/L (ref 15–41)
Albumin: 2.5 g/dL — ABNORMAL LOW (ref 3.5–5.0)
Alkaline Phosphatase: 75 U/L (ref 38–126)
Anion gap: 4 — ABNORMAL LOW (ref 5–15)
BUN: <5 mg/dL — ABNORMAL LOW (ref 6–20)
CO2: 26 mmol/L (ref 22–32)
Calcium: 8.7 mg/dL — ABNORMAL LOW (ref 8.9–10.3)
Chloride: 110 mmol/L (ref 98–111)
Creatinine, Ser: 0.64 mg/dL (ref 0.44–1.00)
GFR calc Af Amer: >60 mL/min (ref >60)
GFR calc non Af Amer: >60 mL/min (ref >60)
Glucose, Bld: 103 mg/dL — ABNORMAL HIGH (ref 70–99)
Potassium: 3.9 mmol/L (ref 3.5–5.1)
Sodium: 140 mmol/L (ref 135–145)
Total Bilirubin: 0.4 mg/dL (ref 0.3–1.2)
Total Protein: 5.4 g/dL — ABNORMAL LOW (ref 6.5–8.1)

## 2018-08-03 LAB — KLEIHAUER-BETKE STAIN
# Vials RhIg: 1
Fetal Cells %: 0 %
Quantitation Fetal Hemoglobin: 0 mL

## 2018-08-03 LAB — CBC
HCT: 33.7 % — ABNORMAL LOW (ref 36.0–46.0)
Hemoglobin: 11.6 g/dL — ABNORMAL LOW (ref 12.0–15.0)
MCH: 30.9 pg (ref 26.0–34.0)
MCHC: 34.4 g/dL (ref 30.0–36.0)
MCV: 89.9 fL (ref 80.0–100.0)
Platelets: 203 10*3/uL (ref 150–400)
RBC: 3.75 MIL/uL — ABNORMAL LOW (ref 3.87–5.11)
RDW: 14 % (ref 11.5–15.5)
WBC: 11.9 10*3/uL — ABNORMAL HIGH (ref 4.0–10.5)
nRBC: 0 % (ref 0.0–0.2)

## 2018-08-03 LAB — WET PREP, GENITAL
Clue Cells Wet Prep HPF POC: NONE SEEN
Sperm: NONE SEEN
Trich, Wet Prep: NONE SEEN
Yeast Wet Prep HPF POC: NONE SEEN

## 2018-08-03 LAB — FIBRINOGEN: Fibrinogen: 451 mg/dL (ref 210–475)

## 2018-08-03 LAB — PROTIME-INR
INR: 1.1 (ref 0.8–1.2)
Prothrombin Time: 13.8 seconds (ref 11.4–15.2)

## 2018-08-03 LAB — SARS CORONAVIRUS 2: SARS Coronavirus 2: NOT DETECTED

## 2018-08-03 LAB — APTT: aPTT: 25 seconds (ref 24–36)

## 2018-08-03 MED ORDER — SODIUM CHLORIDE 0.9 % IV SOLN
INTRAVENOUS | Status: DC
  Administered 2018-08-03: 08:00:00 via INTRAVENOUS

## 2018-08-03 MED ORDER — ACETAMINOPHEN 325 MG PO TABS
650.0000 mg | ORAL_TABLET | ORAL | Status: DC | PRN
  Administered 2018-08-04: 650 mg via ORAL
  Filled 2018-08-03: qty 2

## 2018-08-03 MED ORDER — ZOLPIDEM TARTRATE 5 MG PO TABS: 5.0000 mg | ORAL_TABLET | Freq: Every evening | ORAL | Status: DC | PRN

## 2018-08-03 MED ORDER — PRENATAL MULTIVITAMIN CH
1.0000 | ORAL_TABLET | Freq: Every day | ORAL | Status: DC
  Administered 2018-08-03 – 2018-08-05 (×3): 1 via ORAL
  Filled 2018-08-03 (×4): qty 1

## 2018-08-03 MED ORDER — DEXTROSE IN LACTATED RINGERS 5 % IV SOLN
INTRAVENOUS | Status: DC
  Administered 2018-08-03 – 2018-08-04 (×3): via INTRAVENOUS

## 2018-08-03 MED ORDER — CALCIUM CARBONATE ANTACID 500 MG PO CHEW: 2.0000 | CHEWABLE_TABLET | ORAL | Status: DC | PRN

## 2018-08-03 MED ORDER — DOCUSATE SODIUM 100 MG PO CAPS
100.0000 mg | ORAL_CAPSULE | Freq: Every day | ORAL | Status: DC
  Administered 2018-08-03 – 2018-08-04 (×2): 100 mg via ORAL
  Filled 2018-08-03 (×4): qty 1

## 2018-08-03 NOTE — Consult Note (Signed)
TELEMEDICINE CONSULTATION  Reason for consultation Date of Service: 08/03/18 Requesting phyisican: Freda Munro, MD  I met with Ms. Barbe a G3P2 at 64 w 6 d at the request of Dr. Ouida Sills regarding new diagnosis of placental abruption.  Ms. Ke notes that her pregnancy has overall gone well without complaints. She had low risk genetic screening per Ms.Ovitt.   With regards to the current admission Ms. Alvidrez describes waking up this morning with bleeding. She then went the restroom where the toilet water was bright red. She they came to the hospital. Prior to this time she notes that her pregnancy was uncomplicated.  Upon admission Ms. Bade had a moderate amount of blood noted in the vaginal vault.   Vitals were stable with a mild amount of maternal tachycardia and uncertain hypotension. Her prior blood pressure readings are not available at the time of this consultation.  CBC    Component Value Date/Time   WBC 11.9 (H) 08/03/2018 0756   RBC 3.75 (L) 08/03/2018 0756   HGB 11.6 (L) 08/03/2018 0756   HCT 33.7 (L) 08/03/2018 0756   PLT 203 08/03/2018 0756   MCV 89.9 08/03/2018 0756   MCH 30.9 08/03/2018 0756   MCHC 34.4 08/03/2018 0756   RDW 14.0 08/03/2018 0756   Vitals:   08/03/18 0830 08/03/18 0920 08/03/18 1000 08/03/18 1323  BP:  99/70  (!) 89/60  Pulse:  (!) 123  (!) 120  Resp:  20  19  Temp:    97.6 F (36.4 C)  TempSrc:    Oral  SpO2: 100%     Weight:   69.4 kg   Height:   _0  (1.6 m)    Past Surgical History:  Procedure Laterality Date  . MYRINGOTOMY     Past Medical History:  Diagnosis Date  . Condyloma   . Hx of varicella   . Late prenatal care   . Medical history non-contributory   . UTI (urinary tract infection)    Social History   Socioeconomic History  . Marital status: Single    Spouse name: Not on file  . Number of children: Not on file  . Years of education: Not on file  . Highest education level: Not on file  Occupational  History  . Not on file  Social Needs  . Financial resource strain: Not on file  . Food insecurity:    Worry: Not on file    Inability: Not on file  . Transportation needs:    Medical: Not on file    Non-medical: Not on file  Tobacco Use  . Smoking status: Never Smoker  . Smokeless tobacco: Never Used  Substance and Sexual Activity  . Alcohol use: No  . Drug use: No  . Sexual activity: Not Currently  Lifestyle  . Physical activity:    Days per week: Not on file    Minutes per session: Not on file  . Stress: Not on file  Relationships  . Social connections:    Talks on phone: Not on file    Gets together: Not on file    Attends religious service: Not on file    Active member of club or organization: Not on file    Attends meetings of clubs or organizations: Not on file    Relationship status: Not on file  . Intimate partner violence:    Fear of current or ex partner: Not on file    Emotionally abused: Not on file    Physically  abused: Not on file    Forced sexual activity: Not on file  Other Topics Concern  . Not on file  Social History Narrative  . Not on file   No current facility-administered medications on file prior to encounter.    Current Outpatient Medications on File Prior to Encounter  Medication Sig Dispense Refill  . acetaminophen (TYLENOL) 500 MG tablet Take 1,000 mg by mouth every 6 (six) hours as needed.    . Prenatal Vit-Fe Fumarate-FA (PRENATAL MULTIVITAMIN) TABS tablet Take 1 tablet by mouth daily at 12 noon.     Allergies  Allergen Reactions  . Amoxil [Amoxicillin] Hives  . Penicillins Hives   Family History  Problem Relation Age of Onset  . Hypertension Father   . Mental retardation Maternal Aunt        downs  . Diabetes Maternal Grandmother   . Heart disease Maternal Grandfather   . Cancer Paternal Grandmother        breast   Impression/Counseling:  I explained to Ms. Hunley the definition of placental abruption defined as painless  vaginal bleeding in the third trimester. While she notes some mild braxton hicks uterine contraction she denies painful contractions or pelvic pressure.  She is on procardia to reduce uterine contraction and stabilize clot formation.  Obtain laboratory assessment of bleeding status and risk for DIC ( appears low at this time) as well as rhogam calculation based on KB test.  Continue inpatient management until 24-48 hours of no bleed is present.  Recommendation: 1)  Continue procardia to reduce maternal contraction as long as active bleeding is not present 2) Obtain labs fibrinogen, PT, PTT, INR, CBC, and CMP. KB.- If abnormal repeat level in 4-6 hours 3) Recalculate rhogam based on estimated blood loss to cover and KB. 4) Continue fetal monitoring 5) Large bore IV fluid hydration  6) If blood loss persist consider cross match for 2-4 unites 7) Remain in hospital until 48 hours after last bleed likely admission 3-5 days.   I spent 30 minutes with >50% in direct communication with Ms. Hilaire over webex. All questions answered  Vikki Ports ,MD

## 2018-08-03 NOTE — MAU Note (Signed)
Pt states with previous pregnancies her heart rate was fast at times but never had to see cardiologist

## 2018-08-03 NOTE — Progress Notes (Signed)
KB stain negative. Pt recently had Rhogam at 28 wk check up Pt also states that she always has a rapid heart rate when everytime she becomes preg. Not symptomatic

## 2018-08-03 NOTE — MAU Provider Note (Signed)
History     CSN: [id number]  Arrival date and time: 08/03/18 7829   First Provider Initiated Contact with Patient 08/03/18 [phone]      Chief Complaint  Patient presents with  . Vaginal Bleeding   HPI Jordan Ford is a 33 y.o. [id number] at [redacted]w[redacted]d who presents to MAU with chief complaint of heavy vaginal bleeding. This is a new problem, onset this morning around 0615. Patient states she woke up to void and noticed that the toilet was full of blood.  She denies recent cervical exam, sexual intercourse or fall.  Patient is "aware of" mild and "uncomfortable" lower abdominal contractions. She denies pain and verbalizes "they're not even painful". She has not taken medication or tried other treatments for this complaint.  Patient is blood type O Negative, she received Rhogam in clinic 07/30/18.  She denies leaking of fluid, decreased fetal movement, fever, cough or recent illness.     OB History    Gravida  3   Para  2   Term  2   Preterm      AB      Living  2     SAB      TAB      Ectopic      Multiple      Live Births  2           Past Medical History:  Diagnosis Date  . Condyloma   . Hx of varicella   . Late prenatal care   . Medical history non-contributory   . UTI (urinary tract infection)     Past Surgical History:  Procedure Laterality Date  . MYRINGOTOMY      Family History  Problem Relation Age of Onset  . Hypertension Father   . Mental retardation Maternal Aunt        downs  . Diabetes Maternal Grandmother   . Heart disease Maternal Grandfather   . Cancer Paternal Grandmother        breast    Social History   Tobacco Use  . Smoking status: Never Smoker  . Smokeless tobacco: Never Used  Substance Use Topics  . Alcohol use: No  . Drug use: No    Allergies:  Allergies  Allergen Reactions  . Amoxil [Amoxicillin] Hives  . Penicillins Hives    Medications Prior to Admission  Medication Sig Dispense Refill Last Dose  .  acetaminophen (TYLENOL) 500 MG tablet Take 1,000 mg by mouth every 6 (six) hours as needed.   Past Week at Unknown time  . Prenatal Vit-Fe Fumarate-FA (PRENATAL MULTIVITAMIN) TABS tablet Take 1 tablet by mouth daily at 12 noon.   08/02/2018 at Unknown time    Review of Systems  Constitutional: Negative for chills, fatigue and fever.  Respiratory: Negative for shortness of breath.   Gastrointestinal: Negative for abdominal pain.  Genitourinary: Positive for vaginal bleeding.  Musculoskeletal: Negative for back pain.  Neurological: Negative for syncope and weakness.  All other systems reviewed and are negative.  Physical Exam   Blood pressure 95/71, pulse (!) 132, temperature 97.8 F (36.6 C), resp. rate 18, height  (1.6 m), weight 69.4 kg, SpO2 98 %.  Physical Exam  Nursing note and vitals reviewed. Constitutional: She is oriented to person, place, and time. She appears well-developed and well-nourished.  Cardiovascular: Normal rate.  Respiratory: Effort normal. No respiratory distress.  GI: She exhibits no distension. There is no abdominal tenderness. There is no rebound and no guarding.  Gravid  Genitourinary:    Genitourinary Comments: Moderate amount of dark red blood removed from vagina with fox swab x 2. Scant trickle of new bleeding visible following use of fox swabs   Neurological: She is alert and oriented to person, place, and time.  Skin: Skin is warm and dry.  Psychiatric: She has a normal mood and affect. Her behavior is normal. Judgment and thought content normal.    MAU Course  Procedures  --Discussed Procardia not appropriate given low BP --Prenatal records visible in Media tab, hx Term SVD x 2 (2011 and 2014)  Patient Vitals for the past 24 hrs:  BP Temp Pulse Resp SpO2 Height Weight  08/03/18 0920 99/70 - (!) 123 20 - - -  08/03/18 0830 - - - - 100 % - -  08/03/18 0711 - - (!) 132 - 98 % - -  08/03/18 0709 95/71 - - - - - -  08/03/18 0706 - 97.8 F  (36.6 C) - 18 - 5\' 3"  (1.6 m) 69.4 kg    Results for orders placed or performed during the hospital encounter of 08/03/18 (from the past 24 hour(s))  CBC     Status: Abnormal   Collection Time: 08/03/18  7:56 AM  Result Value Ref Range   WBC 11.9 (H) 4.0 - 10.5 K/uL   RBC 3.75 (L) 3.87 - 5.11 MIL/uL   Hemoglobin 11.6 (L) 12.0 - 15.0 g/dL   HCT 86.533.7 (L) 78.436.0 - 69.646.0 %   MCV 89.9 80.0 - 100.0 fL   MCH 30.9 26.0 - 34.0 pg   MCHC 34.4 30.0 - 36.0 g/dL   RDW 29.514.0 28.411.5 - 13.215.5 %   Platelets 203 150 - 400 K/uL   nRBC 0.0 0.0 - 0.2 %  Wet prep, genital     Status: Abnormal   Collection Time: 08/03/18  8:13 AM  Result Value Ref Range   Yeast Wet Prep HPF POC NONE SEEN NONE SEEN   Trich, Wet Prep NONE SEEN NONE SEEN   Clue Cells Wet Prep HPF POC NONE SEEN NONE SEEN   WBC, Wet Prep HPF POC MANY (A) NONE SEEN   Sperm NONE SEEN     [place] Mfm Ob Limited  Result Date: 08/03/2018 ----------------------------------------------------------------------  OBSTETRICS REPORT                       (Signed Final 08/03/2018 09:25 am) ---------------------------------------------------------------------- Patient Info  ID #:       [id number]                          D.O.B.:  1985/04/23 (32 yrs)  Name:       Jordan Ford                Visit Date: 08/03/2018 08:26 am ---------------------------------------------------------------------- Performed By  Performed By:     Hurman HornAlyssa Keatts          Referred By:      MAU Nursing-                    RDMS                                     MAU/Triage  Attending:        Lin Landsmanorenthian Booker      [facility] ---------------------------------------------------------------------- Orders   #  Description                          Code         Ordered By   1  [place] MFM OB LIMITED                    59563.87     Clayton Bibles   ----------------------------------------------------------------------   #  Order #                    Accession #                 Episode #   1  [id number]                  [id number]                  [id number]  ---------------------------------------------------------------------- Indications   Vaginal bleeding in pregnancy, third trimester O46.93   [redacted] weeks gestation of pregnancy                Z3A.28  ---------------------------------------------------------------------- Vital Signs  Weight (lb): 153                               Height:        5'3"  BMI:         27.1 ---------------------------------------------------------------------- Fetal Evaluation  Num Of Fetuses:         1  Fetal Heart Rate(bpm):  129  Cardiac Activity:       Observed  Presentation:           Cephalic  Placenta:               Anterior, no previa noted  P. Cord Insertion:      Visualized, central  Amniotic Fluid  AFI FV:      Within normal limits  AFI Sum(cm)     %Tile       Largest Pocket(cm)  17.66           67          5.1  RUQ(cm)       RLQ(cm)       LUQ(cm)        LLQ(cm)  5.1           4.77          4.47           3.32  Comment:    Stomach and bladder seen. Small heterogenous area              suggestive of blood noted on lus of placenta (3.7x2.0x3.2cm) ---------------------------------------------------------------------- OB History  Gravidity:    3         Term:   2 ---------------------------------------------------------------------- Gestational Age  Clinical EDD:  28w 6d                                        EDD:   10/20/18  Best:          28w 6d     Det. By:  Clinical EDD             EDD:   10/20/18 ---------------------------------------------------------------------- Cervix Uterus Adnexa  Cervix  Length:            5.6  cm.  Normal appearance by transabdominal scan. Full Bladder.  Uterus  No abnormality visualized.  Left Ovary  Within normal limits.  Right Ovary  Within normal limits.  Adnexa  No abnormality visualized.  ---------------------------------------------------------------------- Impression  Vaginal bleeding in the third trimester  No clear evidence of placental abruption however, there is a  small heterogeneous are in the lower uterine segment  suggestive of a blood at the placental edge.  Placental abruption is a clinical diagnosis, clinical correlation  recommended. ---------------------------------------------------------------------- Recommendations  Clinical correlation recommended. ----------------------------------------------------------------------               Lin Landsman, MD Electronically Signed Final Report   08/03/2018 09:25 am ----------------------------------------------------------------------  Assessment and Plan  --33 y.o. [id number] at [redacted]w[redacted]d  --Ongoing vaginal bleeding  --S/p Rhogam 07/30/2018 --Concern for placental abruption, see MFM notes above regarding clinical correlation --Report called to Dr. Dareen Piano at [phone] --Per Dr. Dareen Piano, admit to [facility] unit  Calvert Cantor, CNM 08/03/2018, 9:46 AM

## 2018-08-03 NOTE — Progress Notes (Signed)
Pt. To have MFM consult. WEBex downloaded on phone, MFM office aware and will call with meeting number when MD is available.

## 2018-08-03 NOTE — MAU Note (Signed)
Awoke at about 0630 with " bleeding like a period x 10". No pain. No recent IC. No prior problems with placenta

## 2018-08-03 NOTE — Plan of Care (Signed)
  Problem: Education: Goal: Knowledge of General Education information will improve Description Including pain rating scale, medication(s)/side effects and non-pharmacologic comfort measures Outcome: Completed/Met

## 2018-08-04 MED ORDER — SODIUM CHLORIDE 0.9% FLUSH
3.0000 mL | Freq: Two times a day (BID) | INTRAVENOUS | Status: DC
  Administered 2018-08-04: 22:00:00 via INTRAVENOUS
  Administered 2018-08-05 (×2): 3 mL via INTRAVENOUS

## 2018-08-04 NOTE — H&P (Signed)
Jordan Ford is an 33 y.o. [id number] [redacted]w[redacted]d white female who was admitted after an episode of non painful vaginal bleeding. PNC had been uncomplicated. She states that she woke up around 6 am and noticed bright red bleeding per vagina. She did not have sex the night before. She has no ho.previa. She had an u/s which indicated that there may be a small abruption. She is having no further bleeding. She has only mild irritability and no ctxs. KB stain was neg. She had Rhogam recently. MFM has consulted and recommends pt stay in house for 48-72 hours after last bleed Past Medical History:  Diagnosis Date  . Condyloma   . Hx of varicella   . Late prenatal care   . Medical history non-contributory   . UTI (urinary tract infection)     Past Surgical History:  Procedure Laterality Date  . MYRINGOTOMY      Family History  Problem Relation Age of Onset  . Hypertension Father   . Mental retardation Maternal Aunt        downs  . Diabetes Maternal Grandmother   . Heart disease Maternal Grandfather   . Cancer Paternal Grandmother        breast   Social History:  reports that she has never smoked. She has never used smokeless tobacco. She reports that she does not drink alcohol or use drugs.  Allergies:  Allergies  Allergen Reactions  . Amoxil [Amoxicillin] Hives  . Penicillins Hives    Medications Prior to Admission  Medication Sig Dispense Refill  . acetaminophen (TYLENOL) 500 MG tablet Take 1,000 mg by mouth every 6 (six) hours as needed.    . Prenatal Vit-Fe Fumarate-FA (PRENATAL MULTIVITAMIN) TABS tablet Take 1 tablet by mouth daily at 12 noon.         Blood pressure 97/61, pulse (!) 125, temperature 97.6 F (36.4 C), temperature source Oral, resp. rate 20, height 5\' 3"  (1.6 m), weight 69.4 kg, SpO2 98 %.   Lab Results  Component Value Date   WBC 11.9 (H) 08/03/2018   HGB 11.6 (L) 08/03/2018   HCT 33.7 (L) 08/03/2018   MCV 89.9 08/03/2018   PLT 203 08/03/2018   Lab  Results  Component Value Date   PREGTESTUR NEGATIVE 08/29/2011      Patient Active Problem List   Diagnosis Date Noted  . Placental abruption 08/03/2018  . ESOPHAGEAL REFLUX 05/18/2009  IMP/ IUP at 29 weeks with vaginal bleeding, possible abruption         Stable         Asymptomatic tachycardia which she stays occurs everytime she is preg Plan/ Continue to observe  Biddie Sebek E 08/04/2018, 8:17 AM

## 2018-08-04 NOTE — Plan of Care (Signed)
  Problem: Education: Goal: Knowledge of disease or condition will improve Outcome: Progressing   

## 2018-08-05 NOTE — Progress Notes (Signed)
Pt has no complaints. No bleeding, No cramping. Good FM  Will discharge to home tomorrow

## 2018-08-06 LAB — GC/CHLAMYDIA PROBE AMP (~~LOC~~) NOT AT ARMC
Chlamydia: NEGATIVE
Neisseria Gonorrhea: NEGATIVE

## 2018-08-06 NOTE — Progress Notes (Signed)
Dr Carlis Abbott at bedside.  She reviewed patients EFM and will discharge her home.

## 2018-08-06 NOTE — Progress Notes (Signed)
Admitted on 6/5 for with diagnosis and suspected placental abruption.  Since admission Friday morning, she has not had any additional bleeding since that time.  On admission, she noted some mild cramping/ braxton hicks contractions.  Those have spaced significantly and are now quite infrequent.  KB was negative and she had rhogam in the office 4 days prior to admission   BP 90/61 (BP Location: Left Arm)   Pulse 63   Temp 97.9 F (36.6 C) (Oral)   Resp 18   Ht 5\' 3"  (1.6 m)   Wt 69.4 kg Comment: per pt. was taken today in MAU  SpO2 96%   BMI 27.10 kg/m   NAD Abdomen: soft, non-tender, gravid Ext: no edema SSE: deferred today, peripad no heme  Toco: rare contractions EFM: 130s, moderate variability, + accels, no decel  A/P: G3P2 @ 29.2 with placenta abruption Has been now 72 hours since any bleeding, will discharge home today F/u in office in 1 week

## 2018-08-06 NOTE — Discharge Summary (Signed)
Obstetric Discharge Summary Reason for Admission: third trimester vaginal bleeding Prenatal Procedures: NST and ultrasound  33 y.o. [id number] [redacted]w[redacted]d white female who was admitted after an episode of non painful vaginal bleeding.  She states that she woke up around 6 am and noticed bright red bleeding per vagina. She did not have sex the night before. She has no ho.previa. She had an u/s which indicated that there may be a small abruption.  She has only mild irritability and no ctxs. KB stain was neg. She had Rhogam 4 days prior in the office. MFM has consulted and recommends pt stay in house for 24-48 hours after last bleed.  She was monitored for 72 hours.  EFM remained reassuring.  She had mild uterine irritability with braxton hicks contractions on admission that spaced.  She never contracted painfully.  She had no further bleeding after admission.   72 hours after admission she was deemed stable for discharge to home I  Hemoglobin  Date Value Ref Range Status  08/03/2018 11.6 (L) 12.0 - 15.0 g/dL Final   HCT  Date Value Ref Range Status  08/03/2018 33.7 (L) 36.0 - 46.0 % Final    Physical Exam:  General: alert, cooperative and appears stated age Abdomen: soft, non-tender  Discharge Diagnoses: third trimester vaginal bleeding, possible placental abruption  Discharge Information: Date: 08/06/2018 Activity: pelvic rest Diet: routine Medications: PNV Condition: improved Instructions: refer to practice specific booklet Discharge to: home Follow-up Information    Jerelyn Charles, MD Follow up in 4 day(s).   Specialty:  Obstetrics Why:  f/u in office on 6/11 or 6/12  Contact information: [place] [address] [phone]           [place] 08/06/2018, 9:08 AM

## 2018-08-07 LAB — TYPE AND SCREEN
ABO/RH(D): O NEG
Antibody Screen: POSITIVE
Unit division: 0
Unit division: 0

## 2018-08-07 LAB — BPAM RBC
Blood Product Expiration Date: 202006192359
Blood Product Expiration Date: 202006202359
ISSUE DATE / TIME: 202005212118
Unit Type and Rh: 9500
Unit Type and Rh: 9500

## 2018-09-28 LAB — OB RESULTS CONSOLE GBS: GBS: NEGATIVE

## 2018-10-10 ENCOUNTER — Encounter (HOSPITAL_COMMUNITY): Payer: Self-pay

## 2018-10-10 ENCOUNTER — Inpatient Hospital Stay (HOSPITAL_COMMUNITY)
Admission: AD | Admit: 2018-10-10 | Discharge: 2018-10-10 | Disposition: A | Discharge status: Home / Self Care | Payer: Medicaid Other | Attending: Obstetrics and Gynecology | Admitting: Obstetrics and Gynecology

## 2018-10-10 ENCOUNTER — Other Ambulatory Visit: Payer: Self-pay

## 2018-10-10 DIAGNOSIS — N898 Other specified noninflammatory disorders of vagina: Secondary | ICD-10-CM | POA: Diagnosis not present

## 2018-10-10 DIAGNOSIS — O471 False labor at or after 37 completed weeks of gestation: Secondary | ICD-10-CM | POA: Diagnosis not present

## 2018-10-10 DIAGNOSIS — Z3A38 38 weeks gestation of pregnancy: Secondary | ICD-10-CM | POA: Insufficient documentation

## 2018-10-10 DIAGNOSIS — Z880 Allergy status to penicillin: Secondary | ICD-10-CM | POA: Diagnosis not present

## 2018-10-10 DIAGNOSIS — O26893 Other specified pregnancy related conditions, third trimester: Secondary | ICD-10-CM | POA: Diagnosis present

## 2018-10-10 DIAGNOSIS — O479 False labor, unspecified: Secondary | ICD-10-CM

## 2018-10-10 LAB — POCT FERN TEST: POCT Fern Test: NEGATIVE

## 2018-10-10 NOTE — MAU Note (Signed)
I have communicated with Hansel Feinstein CNM and reviewed vital signs:  Vitals:   10/10/18 1847 10/10/18 2110  BP: 111/69 105/63  Pulse: 88 99  Resp:  16  Temp:  98.6 F (37 C)    Vaginal exam:  Dilation: 1 Effacement (%): 60 Station: -3 Exam by:: marie williams cnm,   Also reviewed contraction pattern and that non-stress test is reactive.  It has been documented that patient is contracting every 1-6 minutes with no change cervical change over 2 hours not indicating active labor. Last office visit was 1cm.  RN checked slide and fern neg.  Then CNM's slide neg fern slide and checked cervix, 1cm.  Pt didn't want to be rechecked at discharge home.  Patient denies any other complaints.  Based on this report provider has given order for discharge.  A discharge order and diagnosis entered by a provider.   Labor discharge instructions reviewed with patient.

## 2018-10-10 NOTE — Discharge Instructions (Signed)

## 2018-10-10 NOTE — MAU Note (Addendum)
?   Leaking after going to bathroom. Having contractions now, no bleeding.  Was 1 cm at office recently.

## 2018-10-10 NOTE — MAU Provider Note (Signed)
Chief Complaint:  Rupture of Membranes   First Provider Initiated Contact with Patient 10/10/18 2015      HPI: Jordan Ford is a 33 y.o. G3P2002 at 70w4dwho presents to maternity admissions reporting one episode of leaking after urination.  Did not soak clothing.  No further wetness.. She reports good fetal movement, denies vaginal bleeding, vaginal itching/burning, urinary symptoms, h/a, dizziness, n/v, diarrhea, constipation or fever/chills.   RN Note: ? Leaking after going to bathroom. Having contractions now, no bleeding.  Was 1 cm at office recently  Past Medical History: Past Medical History:  Diagnosis Date  . Condyloma   . Hx of varicella   . Late prenatal care   . Medical history non-contributory   . UTI (urinary tract infection)     Past obstetric history: OB History  Gravida Para Term Preterm AB Living  3 2 2     2   SAB TAB Ectopic Multiple Live Births          2    # Outcome Date GA Lbr Len/2nd Weight Sex Delivery Anes PTL Lv  3 Current           2 Term 09/07/12 [redacted]w[redacted]d 17:13 / 00:56 3325 g M Vag-Spont EPI  LIV  1 Term 2011 [redacted]w[redacted]d 24:00 3289 g M Vag-Spont EPI  LIV    Past Surgical History: Past Surgical History:  Procedure Laterality Date  . MYRINGOTOMY      Family History: Family History  Problem Relation Age of Onset  . Hypertension Father   . Mental retardation Maternal Aunt        downs  . Diabetes Maternal Grandmother   . Heart disease Maternal Grandfather   . Cancer Paternal Grandmother        breast    Social History: Social History   Tobacco Use  . Smoking status: Never Smoker  . Smokeless tobacco: Never Used  Substance Use Topics  . Alcohol use: No  . Drug use: No    Allergies:  Allergies  Allergen Reactions  . Amoxil [Amoxicillin] Hives  . Penicillins Hives    Meds:  No medications prior to admission.    I have reviewed patient's Past Medical Hx, Surgical Hx, Family Hx, Social Hx, medications and allergies.   ROS:   Review of Systems  Constitutional: Negative for chills and fever.  Gastrointestinal: Negative for nausea and vomiting.  Genitourinary: Positive for pelvic pain and vaginal discharge. Negative for vaginal bleeding.  Neurological: Negative for dizziness.   Other systems negative  Physical Exam   Patient Vitals for the past 24 hrs:  BP Temp Temp src Pulse Resp  10/10/18 2110 105/63 98.6 F (37 C) Oral 99 16  10/10/18 1847 111/69 - - 13 -   Constitutional: Well-developed, well-nourished female in no acute distress.  Cardiovascular: normal rate and rhythm Respiratory: normal effort GI: Abd soft, non-tender, gravid appropriate for gestational age.   No rebound or guarding. MS: Extremities nontender, no edema, normal ROM Neurologic: Alert and oriented x 4.  GU: Neg CVAT.  PELVIC EXAM: Cervix pink, visually closed, without lesion, moderate white creamy discharge, vaginal walls and external genitalia normal   No Pooling, No ferning  Dilation: 1 Effacement (%): 60 Station: -3 Exam by:: Nicola Quesnell cnm  FHT:  Baseline 140 , moderate variability, accelerations present, no decelerations Contractions: q 3-4 min,  Irregular     Labs: Results for orders placed or performed during the hospital encounter of 10/10/18 (from the past 24 hour(s))  POCT fern test     Status: None   Collection Time: 10/10/18  8:35 PM  Result Value Ref Range   POCT Fern Test Negative = intact amniotic membranes    --/--/O NEG (06/05 0756)  Imaging:  No results found.  MAU Course/MDM: I have ordered labs and reviewed results.  NST reviewed, reactive Treatments in MAU included EFM, sterile speculum exam.    Assessment: 1. Vaginal discharge during pregnancy in third trimester   2. Uterine contractions during pregnancy     Plan: Discharge home Labor precautions and fetal kick counts Follow up in Office for prenatal visits and recheck of cervix Encouraged to return here or to other Urgent Care/ED  if she develops worsening of symptoms, increase in pain, fever, or other concerning symptoms.   Follow-up Information    [facility].   Contact information: [address] [place] [address] [phone]           Pt stable at time of discharge.  Wynelle BourgeoisMarie Immanuel Fedak CNM, MSN Certified Nurse-Midwife 10/10/2018 10:18 PM

## 2018-10-25 ENCOUNTER — Encounter (HOSPITAL_COMMUNITY): Payer: Self-pay

## 2018-10-25 ENCOUNTER — Inpatient Hospital Stay (HOSPITAL_COMMUNITY)
Admission: AD | Admit: 2018-10-25 | Discharge: 2018-10-27 | DRG: 807 | Disposition: A | Discharge status: Home / Self Care | Payer: Medicaid Other | Attending: Obstetrics and Gynecology | Admitting: Obstetrics and Gynecology

## 2018-10-25 ENCOUNTER — Other Ambulatory Visit: Payer: Self-pay

## 2018-10-25 ENCOUNTER — Other Ambulatory Visit: Payer: Self-pay | Admitting: Obstetrics and Gynecology

## 2018-10-25 DIAGNOSIS — Z3A40 40 weeks gestation of pregnancy: Secondary | ICD-10-CM

## 2018-10-25 DIAGNOSIS — O26893 Other specified pregnancy related conditions, third trimester: Secondary | ICD-10-CM | POA: Diagnosis present

## 2018-10-25 DIAGNOSIS — O480 Post-term pregnancy: Principal | ICD-10-CM | POA: Diagnosis present

## 2018-10-25 DIAGNOSIS — Z20828 Contact with and (suspected) exposure to other viral communicable diseases: Secondary | ICD-10-CM | POA: Diagnosis present

## 2018-10-25 DIAGNOSIS — Z6791 Unspecified blood type, Rh negative: Secondary | ICD-10-CM

## 2018-10-25 NOTE — MAU Note (Addendum)
Was for IOL at 66mn and called to be put on standby until bed available. Pt already at hospital so checked in here for evaluation.  Lost mucous plug tonight and having some crampiness. 3cm last sve

## 2018-10-26 ENCOUNTER — Inpatient Hospital Stay (HOSPITAL_COMMUNITY): Payer: Medicaid Other | Admitting: Anesthesiology

## 2018-10-26 ENCOUNTER — Encounter (HOSPITAL_COMMUNITY): Payer: Self-pay | Admitting: *Deleted

## 2018-10-26 DIAGNOSIS — O26893 Other specified pregnancy related conditions, third trimester: Secondary | ICD-10-CM | POA: Diagnosis present

## 2018-10-26 DIAGNOSIS — Z6791 Unspecified blood type, Rh negative: Secondary | ICD-10-CM | POA: Diagnosis not present

## 2018-10-26 DIAGNOSIS — Z3A40 40 weeks gestation of pregnancy: Secondary | ICD-10-CM | POA: Diagnosis not present

## 2018-10-26 DIAGNOSIS — O480 Post-term pregnancy: Secondary | ICD-10-CM | POA: Diagnosis present

## 2018-10-26 DIAGNOSIS — Z20828 Contact with and (suspected) exposure to other viral communicable diseases: Secondary | ICD-10-CM | POA: Diagnosis present

## 2018-10-26 LAB — TYPE AND SCREEN
ABO/RH(D): O NEG
Antibody Screen: NEGATIVE

## 2018-10-26 LAB — CBC
HCT: 36.3 % (ref 36.0–46.0)
Hemoglobin: 12.2 g/dL (ref 12.0–15.0)
MCH: 30.8 pg (ref 26.0–34.0)
MCHC: 33.6 g/dL (ref 30.0–36.0)
MCV: 91.7 fL (ref 80.0–100.0)
Platelets: 178 10*3/uL (ref 150–400)
RBC: 3.96 MIL/uL (ref 3.87–5.11)
RDW: 13.7 % (ref 11.5–15.5)
WBC: 11.8 10*3/uL — ABNORMAL HIGH (ref 4.0–10.5)
nRBC: 0 % (ref 0.0–0.2)

## 2018-10-26 LAB — SARS CORONAVIRUS 2 BY RT PCR (HOSPITAL ORDER, PERFORMED IN ~~LOC~~ HOSPITAL LAB): SARS Coronavirus 2: NEGATIVE

## 2018-10-26 MED ORDER — METHYLERGONOVINE MALEATE 0.2 MG/ML IJ SOLN
0.2000 mg | Freq: Once | INTRAMUSCULAR | Status: AC
  Administered 2018-10-26: 0.2 mg via INTRAMUSCULAR

## 2018-10-26 MED ORDER — LIDOCAINE-EPINEPHRINE (PF) 2 %-1:200000 IJ SOLN
INTRAMUSCULAR | Status: DC | PRN
  Administered 2018-10-26 (×2): 3 mL via EPIDURAL

## 2018-10-26 MED ORDER — LACTATED RINGERS IV SOLN: 500.0000 mL | INTRAVENOUS | Status: DC | PRN

## 2018-10-26 MED ORDER — ACETAMINOPHEN 325 MG PO TABS: 650.0000 mg | ORAL_TABLET | ORAL | Status: DC | PRN

## 2018-10-26 MED ORDER — COCONUT OIL OIL: 1.0000 "application " | TOPICAL_OIL | Status: DC | PRN

## 2018-10-26 MED ORDER — DIPHENHYDRAMINE HCL 50 MG/ML IJ SOLN: 12.5000 mg | INTRAMUSCULAR | Status: DC | PRN

## 2018-10-26 MED ORDER — SODIUM CHLORIDE 0.9% FLUSH
3.0000 mL | Freq: Two times a day (BID) | INTRAVENOUS | Status: DC
  Administered 2018-10-26: 3 mL via INTRAVENOUS

## 2018-10-26 MED ORDER — SODIUM CHLORIDE 0.9% FLUSH: 3.0000 mL | INTRAVENOUS | Status: DC | PRN

## 2018-10-26 MED ORDER — METHYLERGONOVINE MALEATE 0.2 MG/ML IJ SOLN: 0.2000 mg | INTRAMUSCULAR | Status: DC | PRN

## 2018-10-26 MED ORDER — TERBUTALINE SULFATE 1 MG/ML IJ SOLN: 0.2500 mg | Freq: Once | INTRAMUSCULAR | Status: DC | PRN

## 2018-10-26 MED ORDER — OXYTOCIN BOLUS FROM INFUSION
500.0000 mL | Freq: Once | INTRAVENOUS | Status: AC
  Administered 2018-10-26: 500 mL via INTRAVENOUS

## 2018-10-26 MED ORDER — IBUPROFEN 800 MG PO TABS
800.0000 mg | ORAL_TABLET | Freq: Three times a day (TID) | ORAL | Status: DC
  Administered 2018-10-26 – 2018-10-27 (×3): 800 mg via ORAL
  Filled 2018-10-26 (×3): qty 1

## 2018-10-26 MED ORDER — EPHEDRINE 5 MG/ML INJ: 10.0000 mg | INTRAVENOUS | Status: DC | PRN

## 2018-10-26 MED ORDER — SODIUM CHLORIDE (PF) 0.9 % IJ SOLN
INTRAMUSCULAR | Status: DC | PRN
  Administered 2018-10-26: 12 mL/h via EPIDURAL

## 2018-10-26 MED ORDER — FERROUS SULFATE 325 (65 FE) MG PO TABS
325.0000 mg | ORAL_TABLET | Freq: Two times a day (BID) | ORAL | Status: DC
  Administered 2018-10-27: 325 mg via ORAL
  Filled 2018-10-26: qty 1

## 2018-10-26 MED ORDER — FENTANYL-BUPIVACAINE-NACL 0.5-0.125-0.9 MG/250ML-% EP SOLN
12.0000 mL/h | EPIDURAL | Status: DC | PRN
  Filled 2018-10-26: qty 250

## 2018-10-26 MED ORDER — ONDANSETRON HCL 4 MG/2ML IJ SOLN: 4.0000 mg | Freq: Four times a day (QID) | INTRAMUSCULAR | Status: DC | PRN

## 2018-10-26 MED ORDER — BENZOCAINE-MENTHOL 20-0.5 % EX AERO
1.0000 "application " | INHALATION_SPRAY | CUTANEOUS | Status: DC | PRN
  Administered 2018-10-26: 1 via TOPICAL
  Filled 2018-10-26: qty 56

## 2018-10-26 MED ORDER — SIMETHICONE 80 MG PO CHEW: 80.0000 mg | CHEWABLE_TABLET | ORAL | Status: DC | PRN

## 2018-10-26 MED ORDER — MISOPROSTOL 25 MCG QUARTER TABLET
25.0000 ug | ORAL_TABLET | ORAL | Status: DC | PRN
  Filled 2018-10-26: qty 1

## 2018-10-26 MED ORDER — LACTATED RINGERS IV SOLN
INTRAVENOUS | Status: DC
  Administered 2018-10-26 (×2): via INTRAVENOUS

## 2018-10-26 MED ORDER — METHYLERGONOVINE MALEATE 0.2 MG PO TABS: 0.2000 mg | ORAL_TABLET | ORAL | Status: DC | PRN

## 2018-10-26 MED ORDER — WITCH HAZEL-GLYCERIN EX PADS: 1.0000 "application " | MEDICATED_PAD | CUTANEOUS | Status: DC | PRN

## 2018-10-26 MED ORDER — ONDANSETRON HCL 4 MG/2ML IJ SOLN: 4.0000 mg | INTRAMUSCULAR | Status: DC | PRN

## 2018-10-26 MED ORDER — OXYCODONE-ACETAMINOPHEN 5-325 MG PO TABS: 1.0000 | ORAL_TABLET | ORAL | Status: DC | PRN

## 2018-10-26 MED ORDER — PHENYLEPHRINE 40 MCG/ML (10ML) SYRINGE FOR IV PUSH (FOR BLOOD PRESSURE SUPPORT): 80.0000 ug | PREFILLED_SYRINGE | INTRAVENOUS | Status: DC | PRN

## 2018-10-26 MED ORDER — DIPHENHYDRAMINE HCL 25 MG PO CAPS: 25.0000 mg | ORAL_CAPSULE | Freq: Four times a day (QID) | ORAL | Status: DC | PRN

## 2018-10-26 MED ORDER — ZOLPIDEM TARTRATE 5 MG PO TABS: 5.0000 mg | ORAL_TABLET | Freq: Every evening | ORAL | Status: DC | PRN

## 2018-10-26 MED ORDER — MEASLES, MUMPS & RUBELLA VAC IJ SOLR: 0.5000 mL | Freq: Once | INTRAMUSCULAR | Status: DC

## 2018-10-26 MED ORDER — OXYCODONE-ACETAMINOPHEN 5-325 MG PO TABS: 2.0000 | ORAL_TABLET | ORAL | Status: DC | PRN

## 2018-10-26 MED ORDER — OXYTOCIN 40 UNITS IN NORMAL SALINE INFUSION - SIMPLE MED
1.0000 m[IU]/min | INTRAVENOUS | Status: DC
  Administered 2018-10-26: 2 m[IU]/min via INTRAVENOUS

## 2018-10-26 MED ORDER — OXYTOCIN 40 UNITS IN NORMAL SALINE INFUSION - SIMPLE MED
2.5000 [IU]/h | INTRAVENOUS | Status: DC
  Filled 2018-10-26: qty 1000

## 2018-10-26 MED ORDER — SODIUM CHLORIDE 0.9 % IV SOLN: 250.0000 mL | INTRAVENOUS | Status: DC | PRN

## 2018-10-26 MED ORDER — SENNOSIDES-DOCUSATE SODIUM 8.6-50 MG PO TABS
2.0000 | ORAL_TABLET | ORAL | Status: DC
  Administered 2018-10-26: 2 via ORAL
  Filled 2018-10-26: qty 2

## 2018-10-26 MED ORDER — LIDOCAINE HCL (PF) 1 % IJ SOLN: 30.0000 mL | INTRAMUSCULAR | Status: DC | PRN

## 2018-10-26 MED ORDER — TETANUS-DIPHTH-ACELL PERTUSSIS 5-2.5-18.5 LF-MCG/0.5 IM SUSP: 0.5000 mL | Freq: Once | INTRAMUSCULAR | Status: DC

## 2018-10-26 MED ORDER — SOD CITRATE-CITRIC ACID 500-334 MG/5ML PO SOLN: 30.0000 mL | ORAL | Status: DC | PRN

## 2018-10-26 MED ORDER — LACTATED RINGERS IV SOLN: 500.0000 mL | Freq: Once | INTRAVENOUS | Status: DC

## 2018-10-26 MED ORDER — METHYLERGONOVINE MALEATE 0.2 MG/ML IJ SOLN
INTRAMUSCULAR | Status: AC
  Filled 2018-10-26: qty 1

## 2018-10-26 MED ORDER — MAGNESIUM HYDROXIDE 400 MG/5ML PO SUSP
30.0000 mL | ORAL | Status: DC | PRN
  Administered 2018-10-27: 30 mL via ORAL
  Filled 2018-10-26: qty 30

## 2018-10-26 MED ORDER — ONDANSETRON HCL 4 MG PO TABS: 4.0000 mg | ORAL_TABLET | ORAL | Status: DC | PRN

## 2018-10-26 MED ORDER — FLEET ENEMA 7-19 GM/118ML RE ENEM: 1.0000 | ENEMA | RECTAL | Status: DC | PRN

## 2018-10-26 MED ORDER — DIBUCAINE (PERIANAL) 1 % EX OINT: 1.0000 "application " | TOPICAL_OINTMENT | CUTANEOUS | Status: DC | PRN

## 2018-10-26 MED ORDER — PRENATAL MULTIVITAMIN CH
1.0000 | ORAL_TABLET | Freq: Every day | ORAL | Status: DC
  Administered 2018-10-27: 1 via ORAL
  Filled 2018-10-26: qty 1

## 2018-10-26 NOTE — H&P (Signed)
33 y.o. [redacted]w[redacted]d  G3P2002 comes in for postdates induction.  Otherwise has good fetal movement and no bleeding.  Past Medical History:  Diagnosis Date  . Condyloma   . Hx of varicella   . Late prenatal care   . Medical history non-contributory   . UTI (urinary tract infection)     Past Surgical History:  Procedure Laterality Date  . MYRINGOTOMY      OB History  Gravida Para Term Preterm AB Living  3 2 2     2   SAB TAB Ectopic Multiple Live Births          2    # Outcome Date GA Lbr Len/2nd Weight Sex Delivery Anes PTL Lv  3 Current           2 Term 09/07/12 [redacted]w[redacted]d 17:13 / 00:56 3325 g M Vag-Spont EPI  LIV  1 Term 2011 [redacted]w[redacted]d 24:00 3289 g M Vag-Spont EPI  LIV    Social History   Socioeconomic History  . Marital status: Single    Spouse name: Not on file  . Number of children: Not on file  . Years of education: Not on file  . Highest education level: Not on file  Occupational History  . Not on file  Social Needs  . Financial resource strain: Not on file  . Food insecurity    Worry: Not on file    Inability: Not on file  . Transportation needs    Medical: Not on file    Non-medical: Not on file  Tobacco Use  . Smoking status: Never Smoker  . Smokeless tobacco: Never Used  Substance and Sexual Activity  . Alcohol use: No  . Drug use: No  . Sexual activity: Not Currently  Lifestyle  . Physical activity    Days per week: Not on file    Minutes per session: Not on file  . Stress: Not on file  Relationships  . Social Herbalist on phone: Not on file    Gets together: Not on file    Attends religious service: Not on file    Active member of club or organization: Not on file    Attends meetings of clubs or organizations: Not on file    Relationship status: Not on file  . Intimate partner violence    Fear of current or ex partner: Not on file    Emotionally abused: Not on file    Physically abused: Not on file    Forced sexual activity: Not on file  Other  Topics Concern  . Not on file  Social History Narrative  . Not on file   Amoxil [amoxicillin] and Penicillins    Prenatal Transfer Tool  Maternal Diabetes: No Genetic Screening: Normal Maternal Ultrasounds/Referrals: Normal Fetal Ultrasounds or other Referrals:  None Maternal Substance Abuse:  No Significant Maternal Medications:  None Significant Maternal Lab Results: Group B Strep negative and Rh negative  Other PNC: uncomplicated.    Vitals:   10/25/18 2345 10/26/18 0010 10/26/18 0259 10/26/18 0629  BP: 108/67  101/64 98/60  Pulse: 92  75 77  Resp: 16  16 16   Temp: 97.6 F (36.4 C)  97.7 F (36.5 C) 97.7 F (36.5 C)  TempSrc:   Oral Oral  SpO2:  97%    Weight:      Height:        Lungs/Cor:  NAD Abdomen:  soft, gravid Ex:  no cords, erythema SVE:  1/50/-2 in office  FHTs:  110s, good STV, NST R; Cat 1 tracing. Toco:  q occ   A/P  Post dates induction.    GBS neg.  Loney LaurenceMichelle A Draxton Luu

## 2018-10-26 NOTE — Progress Notes (Signed)
Pt sat up and the [place] lost connection with the abdomen.

## 2018-10-26 NOTE — Anesthesia Preprocedure Evaluation (Signed)
Anesthesia Evaluation  Patient identified by MRN, date of birth, ID band Patient awake    Reviewed: Allergy & Precautions, NPO status , Patient's Chart, lab work & pertinent test results  Airway Mallampati: II  TM Distance: >3 FB Neck ROM: Full    Dental no notable dental hx.    Pulmonary neg pulmonary ROS,    Pulmonary exam normal breath sounds clear to auscultation       Cardiovascular negative cardio ROS Normal cardiovascular exam Rhythm:Regular Rate:Normal     Neuro/Psych negative neurological ROS  negative psych ROS   GI/Hepatic Neg liver ROS, GERD  ,  Endo/Other  negative endocrine ROS  Renal/GU negative Renal ROS  negative genitourinary   Musculoskeletal negative musculoskeletal ROS (+)   Abdominal   Peds  Hematology negative hematology ROS (+)   Anesthesia Other Findings   Reproductive/Obstetrics (+) Pregnancy                             Anesthesia Physical Anesthesia Plan  ASA: II  Anesthesia Plan: Epidural   Post-op Pain Management:    Induction:   PONV Risk Score and Plan: Treatment may vary due to age or medical condition  Airway Management Planned: Natural Airway  Additional Equipment:   Intra-op Plan:   Post-operative Plan:   Informed Consent: I have reviewed the patients History and Physical, chart, labs and discussed the procedure including the risks, benefits and alternatives for the proposed anesthesia with the patient or authorized representative who has indicated his/her understanding and acceptance.       Plan Discussed with: Anesthesiologist  Anesthesia Plan Comments: (Patient identified. Risks, benefits, options discussed with patient including but not limited to bleeding, infection, nerve damage, paralysis, failed block, incomplete pain control, headache, blood pressure changes, nausea, vomiting, reactions to medication, itching, and post partum  back pain. Confirmed with bedside nurse the patient's most recent platelet count. Confirmed with the patient that they are not taking any anticoagulation, have any bleeding history or any family history of bleeding disorders. Patient expressed understanding and wishes to proceed. All questions were answered. )        Anesthesia Quick Evaluation  

## 2018-10-26 NOTE — Anesthesia Procedure Notes (Signed)
Epidural Patient location during procedure: OB Start time: 10/26/2018 11:55 AM End time: 10/26/2018 12:10 PM  Staffing Anesthesiologist: Freddrick March, MD Performed: anesthesiologist   Preanesthetic Checklist Completed: patient identified, pre-op evaluation, timeout performed, IV checked, risks and benefits discussed and monitors and equipment checked  Epidural Patient position: sitting Prep: site prepped and draped and DuraPrep Patient monitoring: continuous pulse ox, blood pressure, heart rate and cardiac monitor Approach: midline Location: L3-L4 Injection technique: LOR air  Needle:  Needle type: Tuohy  Needle gauge: 17 G Needle length: 9 cm Needle insertion depth: 5 cm Catheter type: closed end flexible Catheter size: 19 Gauge Catheter at skin depth: 11 cm Test dose: negative  Assessment Sensory level: T8 Events: blood not aspirated, injection not painful, no injection resistance, negative IV test and no paresthesia  Additional Notes Patient identified. Risks/Benefits/Options discussed with patient including but not limited to bleeding, infection, nerve damage, paralysis, failed block, incomplete pain control, headache, blood pressure changes, nausea, vomiting, reactions to medication both or allergic, itching and postpartum back pain. Confirmed with bedside nurse the patient's most recent platelet count. Confirmed with patient that they are not currently taking any anticoagulation, have any bleeding history or any family history of bleeding disorders. Patient expressed understanding and wished to proceed. All questions were answered. Sterile technique was used throughout the entire procedure. Please see nursing notes for vital signs. Test dose was given through epidural catheter and negative prior to continuing to dose epidural or start infusion. Warning signs of high block given to the patient including shortness of breath, tingling/numbness in hands, complete motor block,  or any concerning symptoms with instructions to call for help. Patient was given instructions on fall risk and not to get out of bed. All questions and concerns addressed with instructions to call with any issues or inadequate analgesia.  Reason for block:procedure for pain

## 2018-10-27 LAB — CBC
HCT: 35.9 % — ABNORMAL LOW (ref 36.0–46.0)
Hemoglobin: 12.3 g/dL (ref 12.0–15.0)
MCH: 30.7 pg (ref 26.0–34.0)
MCHC: 34.3 g/dL (ref 30.0–36.0)
MCV: 89.5 fL (ref 80.0–100.0)
Platelets: 164 10*3/uL (ref 150–400)
RBC: 4.01 MIL/uL (ref 3.87–5.11)
RDW: 13.6 % (ref 11.5–15.5)
WBC: 17.2 10*3/uL — ABNORMAL HIGH (ref 4.0–10.5)
nRBC: 0 % (ref 0.0–0.2)

## 2018-10-27 LAB — RPR: RPR Ser Ql: NONREACTIVE

## 2018-10-27 NOTE — Discharge Summary (Signed)
Obstetric Discharge Summary Reason for Admission: induction of labor Prenatal Procedures: none Intrapartum Procedures: spontaneous vaginal delivery Postpartum Procedures: none Complications-Operative and Postpartum: 2 degree perineal laceration Hemoglobin  Date Value Ref Range Status  10/27/2018 12.3 12.0 - 15.0 g/dL Final   HCT  Date Value Ref Range Status  10/27/2018 35.9 (L) 36.0 - 46.0 % Final    Discharge Diagnoses: Term Pregnancy-delivered  Discharge Information: Date: 10/27/2018 Activity: pelvic rest Diet: routine Medications: Ibuprofen Condition: stable Instructions: refer to practice specific booklet Discharge to: home Follow-up Information    Bobbye Charleston, MD Follow up in 4 week(s).   Specialty: Obstetrics and Gynecology Contact information: [address] [place] [address] [phone]           Newborn Data: Live born female  Birth Weight: 7 lb 3.9 oz (3285 g) APGAR: 15, 9  Newborn Delivery   Birth date/time: 10/26/2018 19:23:00 Delivery type: Vaginal, Spontaneous      Home with mother.  Jordan Ford 10/27/2018, 11:37 AM

## 2018-10-27 NOTE — Progress Notes (Signed)
Patient is eating, ambulating, voiding.  Pain control is good.  Vitals:   10/26/18 2157 10/26/18 2249 10/27/18 0245 10/27/18 0834  BP: 108/76 115/81 103/73 100/62  Pulse: 71 85 93 78  Resp: 18 18 17 17   Temp: 98.2 F (36.8 C) 98.1 F (36.7 C) 98.5 F (36.9 C) 97.8 F (36.6 C)  TempSrc: Oral Oral Oral Oral  SpO2: 100% 100% 98%   Weight:      Height:        Fundus firm Perineum without swelling.  Lab Results  Component Value Date   WBC 17.2 (H) 10/27/2018   HGB 12.3 10/27/2018   HCT 35.9 (L) 10/27/2018   MCV 89.5 10/27/2018   PLT 164 10/27/2018    --/--/O NEG (08/28 0134)/RI  A/P Post partum day 1.  Baby is A NEG- no rhogam needed.  Routine care.  Expect d/c routine.    Daria Pastures

## 2018-10-27 NOTE — Lactation Note (Signed)
This note was copied from a baby's chart. Lactation Consultation Note  Patient Name: Jordan Ford Today's Date: 10/27/2018 Reason for consult: Initial assessment;Term P3.  Mom breastfed her last baby for 6 weeks.  Infant is 66 hours old.  Mom attempted once to latch baby to breast and had difficulty so has been formula feeding.  Baby recently ate and is sleeping in crib.  Instructed to feed with cues and to call for [facility] assist when baby is ready to feed.  Mom agreeable.  Breastfeeding consultation services and support information given and reviewed.  Maternal Data Does the patient have breastfeeding experience prior to this delivery?: Yes  Feeding Feeding Type: Formula Nipple Type: Slow - flow  LATCH Score                   Interventions    Lactation Tools Discussed/Used     Consult Status Consult Status: Follow-up Date: 10/28/18 Follow-up type: In-patient    Ave Filter 10/27/2018, 1:26 PM

## 2018-10-27 NOTE — Anesthesia Postprocedure Evaluation (Signed)
Anesthesia Post Note  Patient: Jordan Ford  Procedure(s) Performed: AN AD HOC LABOR EPIDURAL     Patient location during evaluation: Mother Baby Anesthesia Type: Epidural Level of consciousness: awake and alert Pain management: pain level controlled Vital Signs Assessment: post-procedure vital signs reviewed and stable Respiratory status: spontaneous breathing Cardiovascular status: blood pressure returned to baseline Postop Assessment: no headache, adequate PO intake, no backache, patient able to bend at knees, able to ambulate and epidural receding Anesthetic complications: no    Last Vitals:  Vitals:   10/26/18 2249 10/27/18 0245  BP: 115/81 103/73  Pulse: 85 93  Resp: 18 17  Temp: 36.7 C 36.9 C  SpO2: 100% 98%    Last Pain:  Vitals:   10/27/18 0245  TempSrc: Oral  PainSc: 0-No pain   Pain Goal: Patients Stated Pain Goal: 0 (10/26/18 0712)                 Ailene Ards

## 2018-10-28 ENCOUNTER — Ambulatory Visit: Payer: Self-pay

## 2018-10-28 NOTE — Lactation Note (Signed)
This note was copied from a baby's chart. Lactation Consultation Note  Patient Name: Boy Aurea Aronov Today's Date: 10/28/2018 Reason for consult: Term  P3 mother whose infant is now 79 hours old.  Mother breast fed her first child (now 31 years old) for 6 weeks and did not breast feed her second child (now 29 years old).  She desires to breast feed this baby.  Mother has been giving formula via bottle.  She stated that she really would like to breast feed  However, she attempted to feed and was not successful so she started giving formula.    Mother is planning on being discharged today and I presented her options for helping to breast feed and establish a milk supply.  Educated parents on the importance of putting baby to breast first prior to supplementation.  Suggested she call her RN/LC for the next feeding to assist with latching.  Mentioned that it would be a good idea to stay longer today and practice breast feeding since this has not happened since delivery.  Also advised mother to post pump after every feeding, again because the breast stimulation has not occurred and it is now 38 hours past delivery.  Mother willing to do this.    Initiated the DEBP.  Pump parts, assembly, disassembly and cleaning reviewed.  #24 flange is appropriate at this time.  Mother will pump now and after every breast feeding attempt.  Educated on the benefits of breast milk vs formula.    Mother is planning to apply for [facility] and I suggested she call the [facility] office in [facility] as soon as they open tomorrow for eligibility.  Since she does not have a DEBP to take home, father will be looking online to purchase a pump.  He may go to [employer] today and obtain a pump.  [facility] referral faxed.    Baby will be circumcised today and informed parents how this may affect his desire to breast feed initially.  Mother will continue to pump every 2-3 hours throughout her hospital stay.  RN informed of plan.   Maternal  Data Formula Feeding for Exclusion: Yes Reason for exclusion: Mother's choice to formula and breast feed on admission Has patient been taught Hand Expression?: Yes Does the patient have breastfeeding experience prior to this delivery?: Yes  Feeding    LATCH Score                   Interventions    Lactation Tools Discussed/Used WIC Program: Yes Pump Review: Setup, frequency, and cleaning;Milk Storage Initiated by:: Ric Rosenberg Date initiated:: 10/28/18   Consult Status Consult Status: Complete Date: 10/28/18 Follow-up type: Call as needed    Nekhi Liwanag R Coren Sagan 10/28/2018, 9:22 AM

## 2018-10-28 NOTE — Lactation Note (Signed)
This note was copied from a baby's chart. Lactation Consultation Note  Patient Name: Boy Graciana Sessa TUUEK'C Date: 10/28/2018 Reason for consult: Follow-up assessment      LC Follow Up Visit:  Mother wanted to inform me that she was able to pump 10 mls of EBM with her first pumping session.  She was very excited.  Observed her initiate  pumping one final time prior to discharge.  Plan reviewed for obtaining a DEBP.  Mother will call [facility] in [place] first thing in the morning and father is planning to obtain the [employer]ra pump if [facility] will not be able to provide one.  Manual pump reviewed with mother and encouraged her to do a lot of hand expression and pumping until she is able to obtain a DEBP tomorrow.  She will continue to latch baby prior to any supplementation. RN updated.               Consult Status Consult Status: Complete Date: 10/28/18 Follow-up type: Call as needed    Halaina Vanduzer R Takeshia Wenk 10/28/2018, 3:04 PM

## 2020-08-31 IMAGING — US US MFM OB LIMITED
1 series · 15 of 28 positions shown · non-contrast
Comparison: none

[Series 1: us mfm ob limited · 15 of 44 slices shown]
[im 1/44]
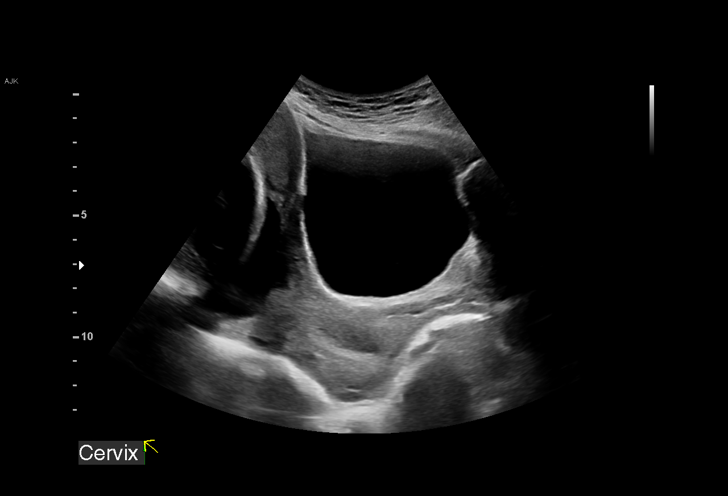
[im 4/44]
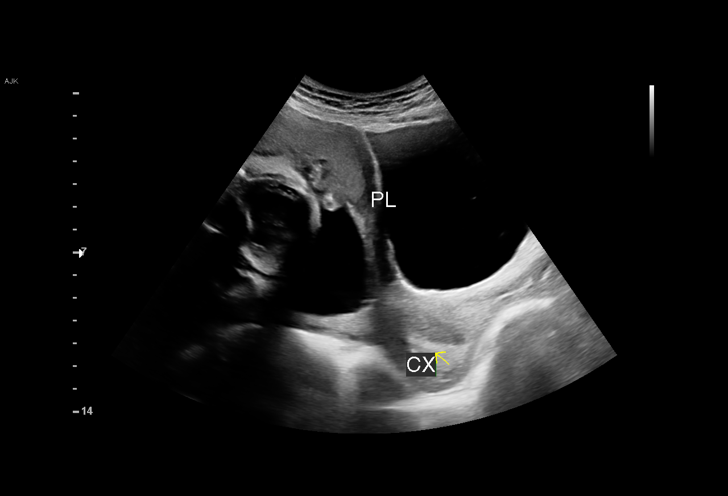
[im 7/44]
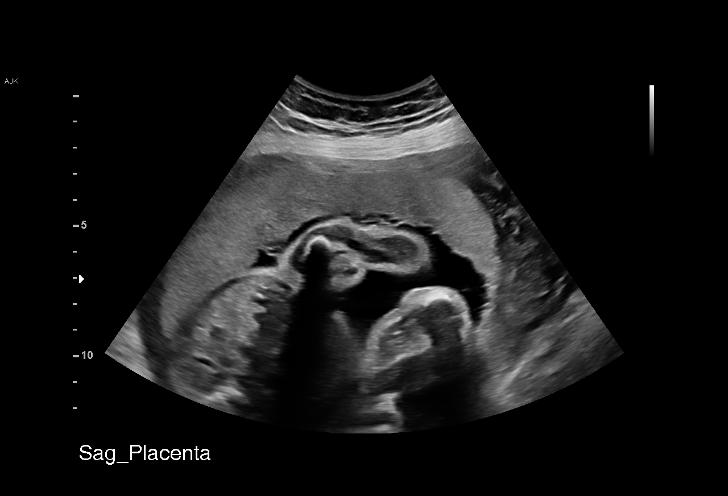
[im 10/44]
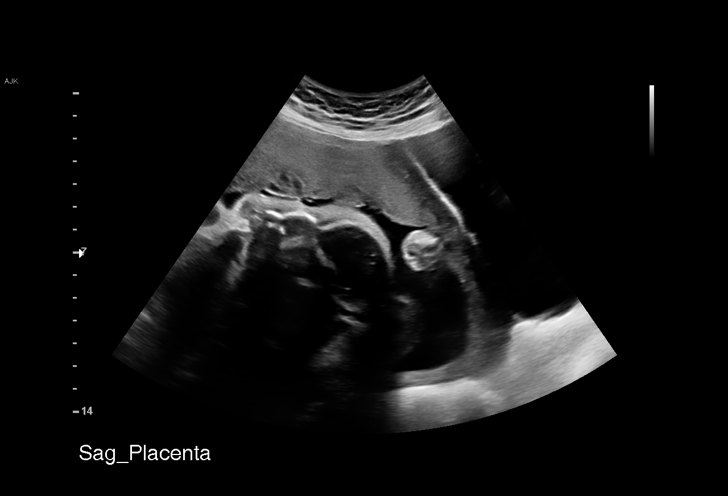
[im 13/44]
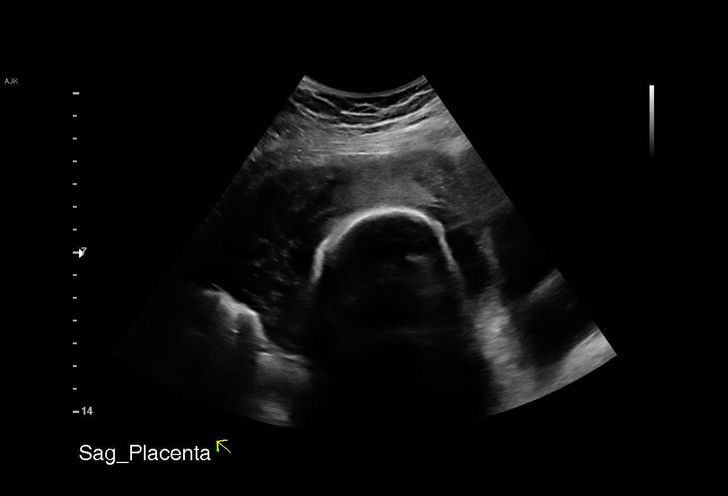
[im 16/44]
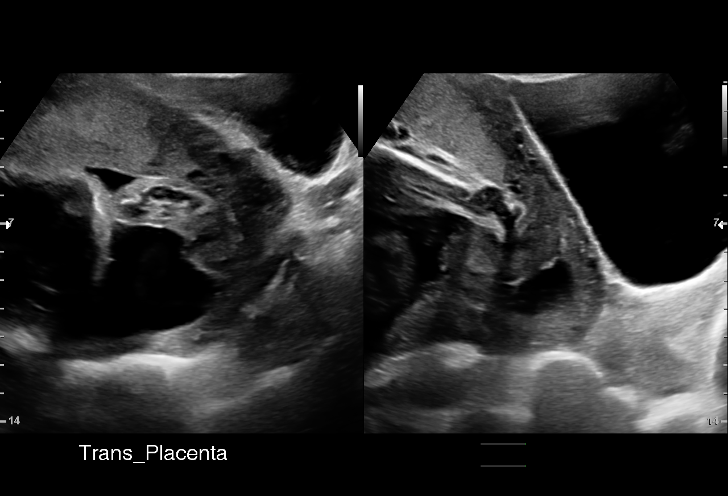
[im 20/44]
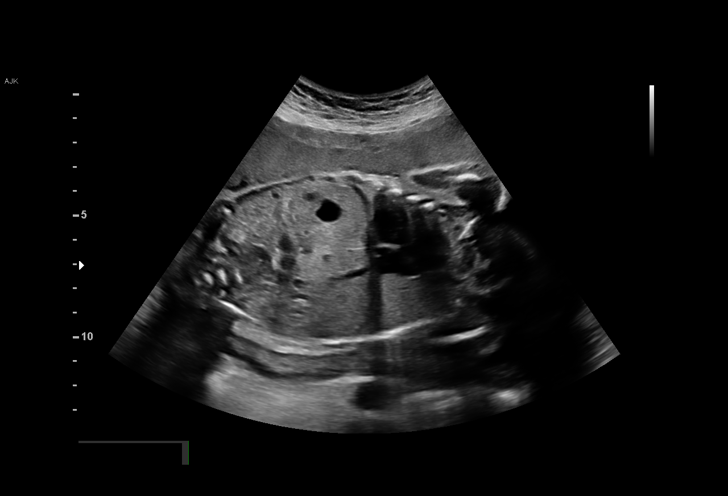
[im 23/44]
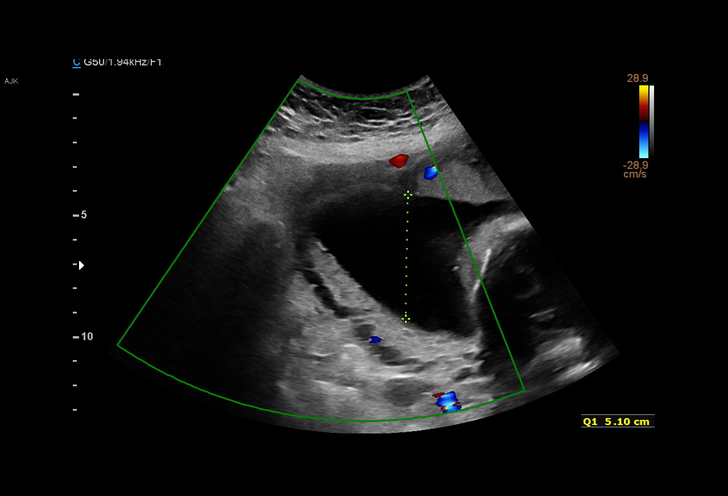
[im 24/44]
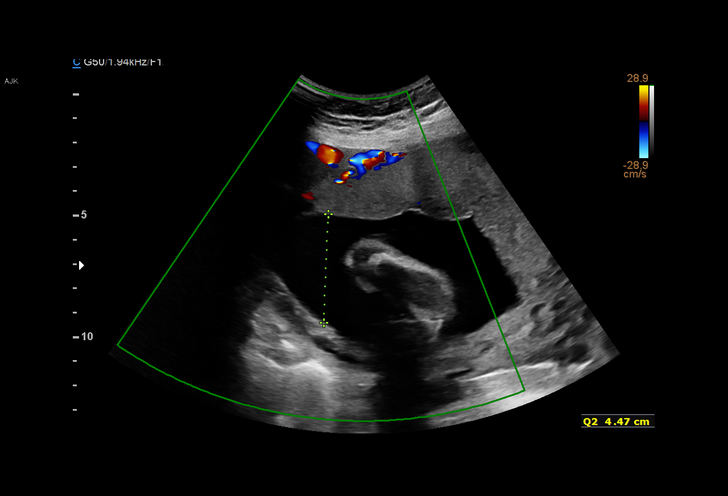
[im 28/44]
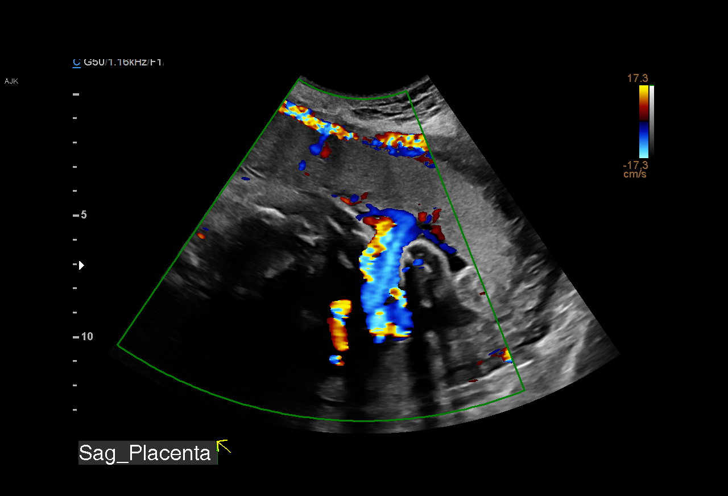
[im 31/44]
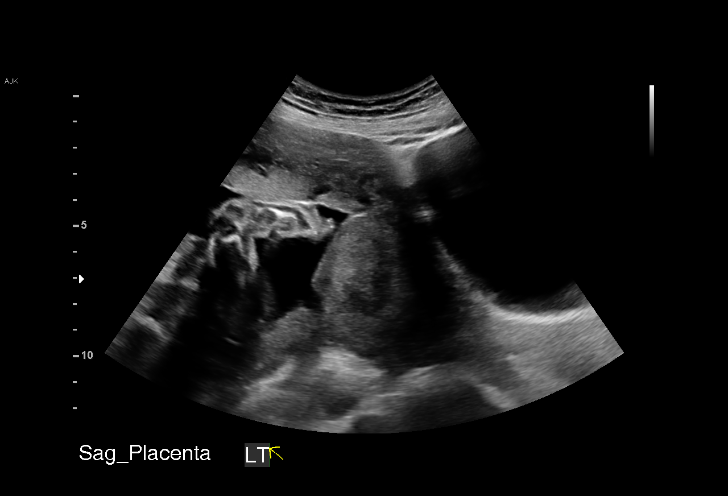
[im 34/44]
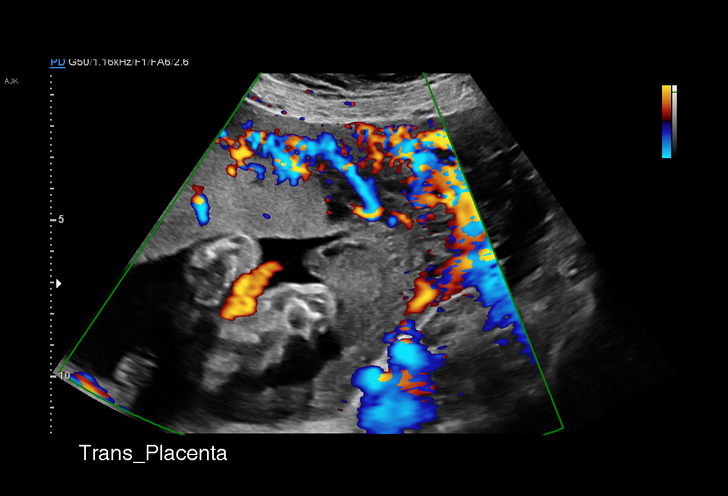
[im 37/44]
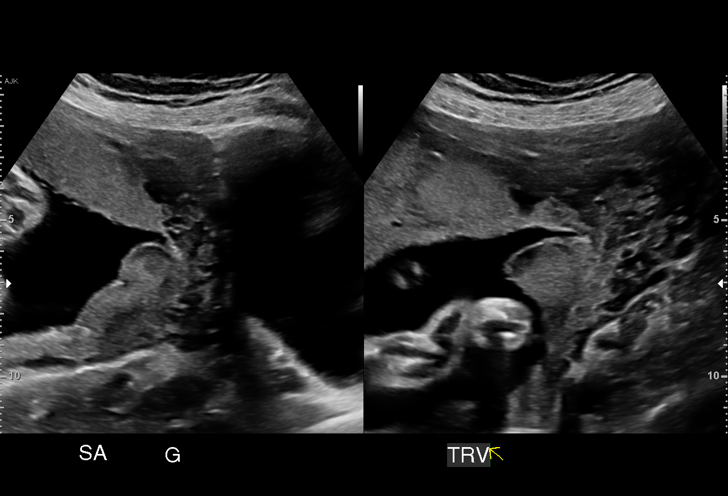
[im 40/44]
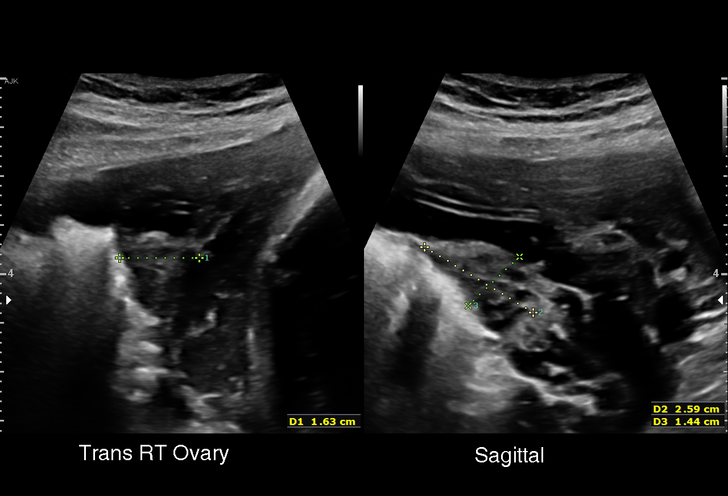
[im 44/44]
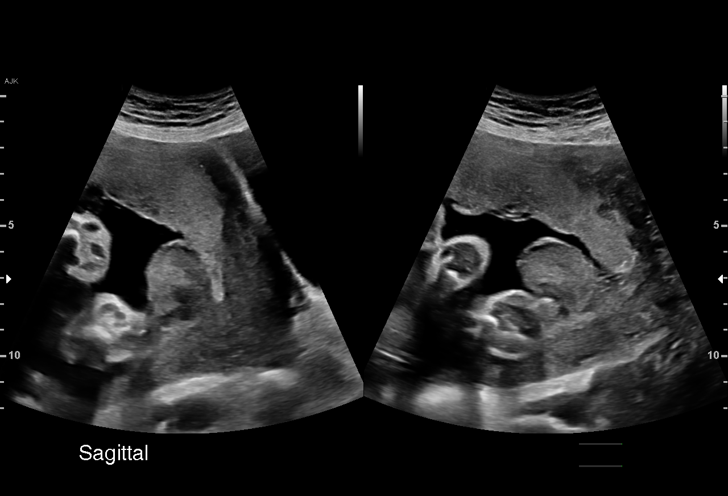

[15 of 28 positions shown; findings below may reference images not displayed]

1  US MFM OB LIMITED                    76815.01     ILLESCAS
                                                       YUK TING
 ----------------------------------------------------------------------

 ----------------------------------------------------------------------
Indications

  Vaginal bleeding in pregnancy, third trimester
  28 weeks gestation of pregnancy
 ----------------------------------------------------------------------
Vital Signs

 BMI:
Fetal Evaluation

 Num Of Fetuses:         1
 Fetal Heart Rate(bpm):  129
 Cardiac Activity:       Observed
 Presentation:           Cephalic
 Placenta:               Anterior, no previa noted
 P. Cord Insertion:      Visualized, central

 Amniotic Fluid
 AFI FV:      Within normal limits

 AFI Sum(cm)     %Tile       Largest Pocket(cm)
 17.66           67

 RUQ(cm)       RLQ(cm)       LUQ(cm)        LLQ(cm)


 Comment:    Stomach and bladder seen. Small heterogenous area
             suggestive of blood noted on lus of placenta (2.0x1.9x2.1cm)
OB History

 Gravidity:    3         Term:   2
Gestational Age

 Clinical EDD:  28w 6d                                        EDD:   10/20/18
 Best:          28w 6d     Det. By:  Clinical EDD             EDD:   10/20/18
Cervix Uterus Adnexa

 Cervix
 Length:            5.6  cm.
 Normal appearance by transabdominal scan. Full Bladder.

 Uterus
 No abnormality visualized.

 Left Ovary
 Within normal limits.

 Right Ovary
 Within normal limits.

 Adnexa
 No abnormality visualized.
Impression

 Vaginal bleeding in the third trimester
 No clear evidence of placental abruption however, there is a
 small heterogeneous are in the lower uterine segment
 suggestive of a blood at the placental edge.
 Placental abruption is a clinical diagnosis, clinical correlation
 recommended.
Recommendations

 Clinical correlation recommended.

## 2020-09-10 ENCOUNTER — Other Ambulatory Visit: Payer: Self-pay

## 2020-09-10 ENCOUNTER — Encounter: Payer: Self-pay | Admitting: Podiatry

## 2020-09-10 ENCOUNTER — Ambulatory Visit: Payer: Medicaid Other | Admitting: Podiatry

## 2020-09-10 DIAGNOSIS — S90862A Insect bite (nonvenomous), left foot, initial encounter: Secondary | ICD-10-CM | POA: Diagnosis not present

## 2020-09-10 DIAGNOSIS — S90862S Insect bite (nonvenomous), left foot, sequela: Secondary | ICD-10-CM

## 2020-09-10 DIAGNOSIS — W57XXXS Bitten or stung by nonvenomous insect and other nonvenomous arthropods, sequela: Secondary | ICD-10-CM | POA: Diagnosis not present

## 2020-09-10 MED ORDER — CLOTRIMAZOLE-BETAMETHASONE 1-0.05 % EX CREA: 1.0000 "application " | TOPICAL_CREAM | Freq: Every day | CUTANEOUS | 0 refills | Status: DC

## 2020-09-10 MED ORDER — DOXYCYCLINE HYCLATE 100 MG PO TABS: 100.0000 mg | ORAL_TABLET | Freq: Two times a day (BID) | ORAL | 0 refills | Status: AC

## 2020-09-10 NOTE — Progress Notes (Signed)
  Subjective:  Patient ID: Lawson Radar, female    DOB: 1985-08-24,  MRN: [id number]  Chief Complaint  Patient presents with   Toe Injury    NP  Tick bite great toe    35 y.o. female presents with the above complaint. History confirmed with patient.  Suffered a tick bite in April she IDed as a [place] tick and it has been slow to heal and persistently bothersome.  She took a round of Keflex and doxycycline for 10 days.  Occasionally itches and drains a clear fluid  Objective:  Physical Exam: warm, good capillary refill, no trophic changes or ulcerative lesions, normal DP and PT pulses, and normal sensory exam. Left Foot: Lateral hallux there is a small scab and area of hyperkeratinization around this     Assessment:   1. Tick bite of left foot, sequela      Plan:  Patient was evaluated and treated and all questions answered.  Discussed with her the possible this is persistent infection versus coinfection with fungal or bacterial agents.  Also possible this is persistent dermatitis.  Clotrimazole betamethasone cream was prescribed for her as well as another round of doxycycline deceiving get this to clear up.  If not improving by next visit consider advanced imaging and lab work to evaluate further  Return in about 2 weeks (around 09/24/2020) for f/u tick bite .

## 2020-09-18 ENCOUNTER — Telehealth: Payer: Self-pay | Admitting: Podiatry

## 2020-09-18 NOTE — Telephone Encounter (Signed)
Patient called the office wanting to know if she could get another antibiotic other than doxycyline due to her having a fungal infection.

## 2020-09-29 ENCOUNTER — Other Ambulatory Visit: Payer: Self-pay

## 2020-09-29 ENCOUNTER — Ambulatory Visit: Payer: Medicaid Other | Admitting: Podiatry

## 2020-09-29 DIAGNOSIS — L02611 Cutaneous abscess of right foot: Secondary | ICD-10-CM | POA: Diagnosis not present

## 2020-09-29 DIAGNOSIS — W57XXXS Bitten or stung by nonvenomous insect and other nonvenomous arthropods, sequela: Secondary | ICD-10-CM | POA: Diagnosis not present

## 2020-09-29 DIAGNOSIS — S90861S Insect bite (nonvenomous), right foot, sequela: Secondary | ICD-10-CM | POA: Diagnosis not present

## 2020-09-29 NOTE — Progress Notes (Signed)
  Subjective:  Patient ID: Jordan Ford, female    DOB: [birth year],  MRN: [id number]  Chief Complaint  Patient presents with   Follow-up    F/u from a tick bite pt states she has stop taking the doxycycline due to a yeast infection but overall she her toe is getting better     35 y.o. female returns for follow-up with the above complaint. History confirmed with patient.  Has not improved.  She had to stop taking the doxycycline due to a yeast infection  Objective:  Physical Exam: warm, good capillary refill, no trophic changes or ulcerative lesions, normal DP and PT pulses, and normal sensory exam. Left Foot: Lateral hallux there is a small scab and area of hyperkeratinization around this, minimal changes still tender and erythematous     Assessment:   1. Tick bite of right foot, sequela   2. Abscess of right foot      Plan:  Patient was evaluated and treated and all questions answered.  Continue Lotrisone cream.  My chief concern at this point is that there is a deeper fluid collection.  I am sending her for lab work to evaluate for deeper infection as well as an MRI to evaluate this.  Return in 4 weeks after MRI.  Return in about 4 weeks (around 10/27/2020).

## 2020-10-04 ENCOUNTER — Encounter: Payer: Self-pay | Admitting: Podiatry

## 2020-10-18 ENCOUNTER — Other Ambulatory Visit: Payer: Medicaid Other

## 2020-10-29 ENCOUNTER — Ambulatory Visit: Payer: Medicaid Other | Admitting: Podiatry

## 2020-10-31 ENCOUNTER — Ambulatory Visit
Admission: RE | Admit: 2020-10-31 | Discharge: 2020-10-31 | Disposition: A | Discharge status: Home / Self Care | Payer: Medicaid Other | Source: Ambulatory Visit | Attending: Podiatry | Admitting: Podiatry

## 2020-10-31 DIAGNOSIS — L02611 Cutaneous abscess of right foot: Secondary | ICD-10-CM

## 2020-10-31 DIAGNOSIS — S90861S Insect bite (nonvenomous), right foot, sequela: Secondary | ICD-10-CM

## 2020-10-31 MED ORDER — GADOBENATE DIMEGLUMINE 529 MG/ML IV SOLN
12.0000 mL | Freq: Once | INTRAVENOUS | Status: AC | PRN
  Administered 2020-10-31: 12 mL via INTRAVENOUS

## 2020-11-24 ENCOUNTER — Ambulatory Visit: Payer: Medicaid Other | Admitting: Podiatry

## 2020-11-24 ENCOUNTER — Other Ambulatory Visit: Payer: Self-pay

## 2020-11-24 DIAGNOSIS — S90861S Insect bite (nonvenomous), right foot, sequela: Secondary | ICD-10-CM

## 2020-11-24 DIAGNOSIS — W57XXXS Bitten or stung by nonvenomous insect and other nonvenomous arthropods, sequela: Secondary | ICD-10-CM

## 2020-11-24 DIAGNOSIS — L03115 Cellulitis of right lower limb: Secondary | ICD-10-CM

## 2020-11-25 NOTE — Progress Notes (Signed)
  Subjective:  Patient ID: Jordan Ford, female    DOB: 12-Oct-1985,  MRN: [id number]  Chief Complaint  Patient presents with   abscess    4 week follow up tick bite    35 y.o. female returns for follow-up with the above complaint. History confirmed with patient.  She has been using the Lotrisone she completed the MRI  Objective:  Physical Exam: warm, good capillary refill, no trophic changes or ulcerative lesions, normal DP and PT pulses, and normal sensory exam. Left Foot: Small area of scab still present but is much improved  MRI results with no abscess, mild cellulitis and possible synovitis of the first MTPJ   Assessment:   1. Tick bite of right foot, sequela      Plan:  Patient was evaluated and treated and all questions answered.  Continue Lotrisone cream until skin has completely healed.  MRI reviewed there is no evidence of abscess.  No indication for surgical intervention at this time  Return if symptoms worsen or fail to improve.

## 2022-08-18 ENCOUNTER — Ambulatory Visit: Payer: Medicaid Other | Admitting: Nurse Practitioner

## 2022-08-19 ENCOUNTER — Ambulatory Visit: Payer: Medicaid Other | Admitting: Nurse Practitioner

## 2022-09-29 ENCOUNTER — Telehealth: Payer: Self-pay | Admitting: Nurse Practitioner

## 2022-09-29 ENCOUNTER — Ambulatory Visit: Payer: Medicaid Other | Admitting: Nurse Practitioner

## 2022-09-29 VITALS — BP 98/60 | HR 81 | Temp 98.4°F | Ht 63.0 in | Wt 136.1 lb

## 2022-09-29 DIAGNOSIS — Z131 Encounter for screening for diabetes mellitus: Secondary | ICD-10-CM

## 2022-09-29 DIAGNOSIS — K5900 Constipation, unspecified: Secondary | ICD-10-CM | POA: Diagnosis not present

## 2022-09-29 DIAGNOSIS — Z136 Encounter for screening for cardiovascular disorders: Secondary | ICD-10-CM

## 2022-09-29 DIAGNOSIS — Z13220 Encounter for screening for lipoid disorders: Secondary | ICD-10-CM | POA: Diagnosis not present

## 2022-09-29 DIAGNOSIS — Z0001 Encounter for general adult medical examination with abnormal findings: Secondary | ICD-10-CM | POA: Diagnosis not present

## 2022-09-29 DIAGNOSIS — R002 Palpitations: Secondary | ICD-10-CM

## 2022-09-29 LAB — COMPREHENSIVE METABOLIC PANEL
ALT: 19 U/L (ref 0–35)
AST: 24 U/L (ref 0–37)
Albumin: 4.2 g/dL (ref 3.5–5.2)
Alkaline Phosphatase: 48 U/L (ref 39–117)
BUN: 13 mg/dL (ref 6–23)
CO2: 25 mEq/L (ref 19–32)
Calcium: 9.2 mg/dL (ref 8.4–10.5)
Chloride: 104 mEq/L (ref 96–112)
Creatinine, Ser: 0.93 mg/dL (ref 0.40–1.20)
GFR: 79.91 mL/min (ref >60.00)
Glucose, Bld: 86 mg/dL (ref 70–99)
Potassium: 3.9 mEq/L (ref 3.5–5.1)
Sodium: 136 mEq/L (ref 135–145)
Total Bilirubin: 0.3 mg/dL (ref 0.2–1.2)
Total Protein: 7.2 g/dL (ref 6.0–8.3)

## 2022-09-29 LAB — CBC
HCT: 38.3 % (ref 36.0–46.0)
Hemoglobin: 12.5 g/dL (ref 12.0–15.0)
MCHC: 32.8 g/dL (ref 30.0–36.0)
MCV: 87 fl (ref 78.0–100.0)
Platelets: 274 10*3/uL (ref 150.0–400.0)
RBC: 4.4 Mil/uL (ref 3.87–5.11)
RDW: 13.4 % (ref 11.5–15.5)
WBC: 8.5 10*3/uL (ref 4.0–10.5)

## 2022-09-29 LAB — LIPID PANEL
Cholesterol: 186 mg/dL (ref 0–200)
HDL: 70 mg/dL (ref >39.00)
LDL Cholesterol: 93 mg/dL (ref 0–99)
NonHDL: 115.87
Total CHOL/HDL Ratio: 3
Triglycerides: 115 mg/dL (ref 0.0–149.0)
VLDL: 23 mg/dL (ref 0.0–40.0)

## 2022-09-29 LAB — TSH: TSH: 2.16 u[IU]/mL (ref 0.35–5.50)

## 2022-09-29 LAB — HEMOGLOBIN A1C: Hgb A1c MFr Bld: 4.9 % (ref 4.6–6.5)

## 2022-09-29 NOTE — Assessment & Plan Note (Signed)
Routine health screening recommendations discussed.  Will reach out to OB/GYN office for previous medical records.  Handout provided.

## 2022-09-29 NOTE — Telephone Encounter (Signed)
Please request previous medical records from Dr. Aldona Bar OB/[facility]N at [facility].  We need past Pap smear results as well as immunization record.

## 2022-09-29 NOTE — Assessment & Plan Note (Signed)
Labs ordered, further recommendations may be made based upon these results. 

## 2022-09-29 NOTE — Telephone Encounter (Signed)
Pap smear request send via mychart to [facility]

## 2022-09-29 NOTE — Assessment & Plan Note (Signed)
Chronic, intermittent For now recommend drinking at least 60 ounces of water daily, increasing fiber intake in diet, as well as adding Metamucil supplementation.  If symptoms persist or worsen will consider further evaluation.

## 2022-09-29 NOTE — Assessment & Plan Note (Signed)
Intermittent, per shared decision making for now patient will monitor if palpitations occur more frequently or last for a longer period time or cause discomfort will evaluate further.  Will also check some labs today to rule out thyroid disease and anemia.

## 2022-09-29 NOTE — Progress Notes (Signed)
Complete physical exam  Patient: Jordan Ford   DOB: 11/06/1985   37 y.o. Female  MRN: [id number]  Subjective:    Chief Complaint  Patient presents with   Constipation    Jordan Ford is a 37 y.o. female who presents today for a complete physical exam. She reports consuming a general diet.  5-6 days week cardio and weight lifting  She generally feels well. She reports sleeping well. She does have additional problems to discuss today.  Constipation: Reports intermittent bloating and abdominal pain.  Reports 3-4 bowel movements per week.  Bowel movement will sometimes be loose and sometimes be constipated.  Reports drinking plenty of water during the day, exercises regularly, tries to eat vegetables with meals.  Denies any unintentional weight loss, change in stool caliber or characteristics, denies blood in stool.  Has not tried any over-the-counter treatment. During review of systems she also reports that she has some cardiac palpitations that occur monthly or less, resolves spontaneously within a few moments, do not result in any significant chest pain or shortness of breath.  Most recent fall risk assessment:     No data to display           Most recent depression screenings:     No data to display          Vision:Not within last year  and Dental: No current dental problems and Receives regular dental care  Past Medical History:  Diagnosis Date   Condyloma    Hx of varicella    Late prenatal care    Medical history non-contributory    UTI (urinary tract infection)    Past Surgical History:  Procedure Laterality Date   MYRINGOTOMY     Social History   Socioeconomic History   Marital status: Single    Spouse name: Not on file   Number of children: Not on file   Years of education: Not on file   Highest education level: Not on file  Occupational History   Not on file  Tobacco Use   Smoking status: Never   Smokeless tobacco: Never  Vaping Use    Vaping status: Never Used  Substance and Sexual Activity   Alcohol use: No   Drug use: No   Sexual activity: Not Currently  Other Topics Concern   Not on file  Social History Narrative   Not on file   Social Determinants of Health   Financial Resource Strain: Not on file  Food Insecurity: Not on file  Transportation Needs: Not on file  Physical Activity: Not on file  Stress: Not on file  Social Connections: Not on file  Intimate Partner Violence: Not on file   Family History  Problem Relation Age of Onset   Hypertension Father    Mental retardation Maternal Aunt        downs   Diabetes Maternal Grandmother    Heart disease Maternal Grandfather    Cancer Paternal Grandmother        breast   Allergies  Allergen Reactions   Latex Itching   Amoxil [Amoxicillin] Hives   Penicillins Hives      Patient Care Team: Elenore Paddy, NP as PCP - General (Nurse Practitioner)   Outpatient Medications Prior to Visit  Medication Sig   fluticasone (FLONASE) 50 MCG/ACT nasal spray Place 1 spray into both nostrils daily.   psyllium (METAMUCIL) 58.6 % powder Take 1 packet by mouth 3 (three) times daily.   VELIVET 0.1/0.125/0.15 -  0.025 MG tablet Take 1 tablet by mouth daily.   [DISCONTINUED] acetaminophen (TYLENOL) 500 MG tablet Take 1,000 mg by mouth every 6 (six) hours as needed.   [DISCONTINUED] clotrimazole-betamethasone (LOTRISONE) cream Apply 1 application topically daily.   [DISCONTINUED] Prenatal Vit-Fe Fumarate-FA (PRENATAL MULTIVITAMIN) TABS tablet Take 1 tablet by mouth daily at 12 noon.   No facility-administered medications prior to visit.    Review of Systems  Constitutional:  Negative for fever, malaise/fatigue and weight loss.  Eyes:  Negative for blurred vision and double vision.  Respiratory:  Negative for cough, shortness of breath and wheezing.   Cardiovascular:  Positive for palpitations. Negative for chest pain.  Gastrointestinal:  Negative for blood in  stool.  Genitourinary:  Negative for hematuria.  Skin:  Negative for itching and rash.  Neurological:  Negative for seizures and loss of consciousness.  Psychiatric/Behavioral:  Negative for depression and suicidal ideas. The patient is not nervous/anxious and does not have insomnia.           Objective:     BP 98/60   Pulse 81   Temp 98.4 F (36.9 C) (Oral)   Ht 5\' 3"  (1.6 m)   Wt 136 lb 2 oz (61.7 kg)   SpO2 99%   BMI 24.11 kg/m  BP Readings from Last 3 Encounters:  09/29/22 98/60  10/27/18 100/62  10/10/18 105/63   Wt Readings from Last 3 Encounters:  09/29/22 136 lb 2 oz (61.7 kg)  10/26/18 161 lb (73 kg)  08/03/18 153 lb (69.4 kg)      Physical Exam Vitals reviewed. Exam conducted with a chaperone present.  Constitutional:      Appearance: Normal appearance.  HENT:     Head: Normocephalic and atraumatic.     Right Ear: Tympanic membrane, ear canal and external ear normal.     Left Ear: Tympanic membrane, ear canal and external ear normal.  Eyes:     General:        Right eye: No discharge.        Left eye: No discharge.     Extraocular Movements: Extraocular movements intact.     Conjunctiva/sclera: Conjunctivae normal.     Pupils: Pupils are equal, round, and reactive to light.  Neck:     Vascular: No carotid bruit.  Cardiovascular:     Rate and Rhythm: Normal rate and regular rhythm.     Pulses: Normal pulses.     Heart sounds: Normal heart sounds. No murmur heard. Pulmonary:     Effort: Pulmonary effort is normal.     Breath sounds: Normal breath sounds.  Chest:  Breasts:    Breasts are symmetrical.     Right: Normal.     Left: Normal.  Abdominal:     General: Abdomen is flat. Bowel sounds are normal. There is no distension.     Palpations: Abdomen is soft. There is no mass.     Tenderness: There is no abdominal tenderness.  Musculoskeletal:        General: No tenderness.     Cervical back: Neck supple. No muscular tenderness.     Right  lower leg: No edema.     Left lower leg: No edema.  Lymphadenopathy:     Cervical: No cervical adenopathy.     Upper Body:     Right upper body: No supraclavicular adenopathy.     Left upper body: No supraclavicular adenopathy.  Skin:    General: Skin is warm and dry.  Neurological:  General: No focal deficit present.     Mental Status: She is alert and oriented to person, place, and time.     Motor: No weakness.     Gait: Gait normal.  Psychiatric:        Mood and Affect: Mood normal.        Behavior: Behavior normal.        Judgment: Judgment normal.      No results found for any visits on 09/29/22.      Assessment & Plan:    Routine Health Maintenance and Physical Exam  Immunization History  Administered Date(s) Administered   Tdap 09/08/2012    Health Maintenance  Topic Date Due   Hepatitis C Screening  Never done   PAP SMEAR-Modifier  Never done   COVID-19 Vaccine (1 - 2023-24 season) Never done   DTaP/Tdap/Td (2 - Td or Tdap) 09/09/2022   INFLUENZA VACCINE  09/29/2022   HIV Screening  Completed   HPV VACCINES  Aged Out    Discussed health benefits of physical activity, and encouraged her to engage in regular exercise appropriate for her age and condition.  Problem List Items Addressed This Visit       Other   Encounter for general adult medical examination with abnormal findings - Primary    Routine health screening recommendations discussed.  Will reach out to OB/GYN office for previous medical records.  Handout provided.      Relevant Orders   CBC   Comprehensive metabolic panel   Hemoglobin A1c   Lipid panel   TSH   Diabetes mellitus screening    Labs ordered, further recommendations may be made based upon these results       Relevant Orders   CBC   Comprehensive metabolic panel   Hemoglobin A1c   Lipid panel   TSH   Encounter for lipid screening for cardiovascular disease    Labs ordered, further recommendations may be made based  upon these results       Relevant Orders   CBC   Comprehensive metabolic panel   Hemoglobin A1c   Lipid panel   TSH   Palpitations    Intermittent, per shared decision making for now patient will monitor if palpitations occur more frequently or last for a longer period time or cause discomfort will evaluate further.  Will also check some labs today to rule out thyroid disease and anemia.      Relevant Orders   CBC   Comprehensive metabolic panel   Hemoglobin A1c   Lipid panel   TSH   Constipation    Chronic, intermittent For now recommend drinking at least 60 ounces of water daily, increasing fiber intake in diet, as well as adding Metamucil supplementation.  If symptoms persist or worsen will consider further evaluation.      Relevant Orders   CBC   Comprehensive metabolic panel   Hemoglobin A1c   Lipid panel   TSH   Return in about 3 months (around 12/30/2022) for F/U with Jarryd Gratz.     Elenore Paddy, NP

## 2022-10-25 ENCOUNTER — Telehealth: Payer: Medicaid Other | Admitting: Family Medicine

## 2022-10-25 DIAGNOSIS — T63301A Toxic effect of unspecified spider venom, accidental (unintentional), initial encounter: Secondary | ICD-10-CM

## 2022-10-25 DIAGNOSIS — L03113 Cellulitis of right upper limb: Secondary | ICD-10-CM

## 2022-10-25 DIAGNOSIS — Z7185 Encounter for immunization safety counseling: Secondary | ICD-10-CM

## 2022-10-25 NOTE — Progress Notes (Signed)
Virtual Visit Consent   Jordan Ford, you are scheduled for a virtual visit with a [facility] provider today. Just as with appointments in the office, your consent must be obtained to participate. Your consent will be active for this visit and any virtual visit you may have with one of our providers in the next 365 days. If you have a MyChart account, a copy of this consent can be sent to you electronically.  As this is a virtual visit, video technology does not allow for your provider to perform a traditional examination. This may limit your provider's ability to fully assess your condition. If your provider identifies any concerns that need to be evaluated in person or the need to arrange testing (such as labs, EKG, etc.), we will make arrangements to do so. Although advances in technology are sophisticated, we cannot ensure that it will always work on either your end or our end. If the connection with a video visit is poor, the visit may have to be switched to a telephone visit. With either a video or telephone visit, we are not always able to ensure that we have a secure connection.  By engaging in this virtual visit, you consent to the provision of healthcare and authorize for your insurance to be billed (if applicable) for the services provided during this visit. Depending on your insurance coverage, you may receive a charge related to this service.  I need to obtain your verbal consent now. Are you willing to proceed with your visit today? Jordan Ford has provided verbal consent on 10/25/2022 for a virtual visit (video or telephone). Freddy Finner, NP  Date: 10/25/2022 9:39 AM  Virtual Visit via Video Note   I, Freddy Finner, connected with  Jordan Ford  ([id number], 1985/03/16) on 10/25/22 at  9:30 AM EDT by a video-enabled telemedicine application and verified that I am speaking with the correct person using two identifiers.  Location: Patient: Virtual Visit Location Patient:  Home Provider: Virtual Visit Location Provider: Home Office   I discussed the limitations of evaluation and management by telemedicine and the availability of in person appointments. The patient expressed understanding and agreed to proceed.    History of Present Illness: Jordan Ford is a 37 y.o. who identifies as a female who was assigned female at birth, and is being seen today for spider bite  Onset was Sunday night- felt a sting and burning happen, but did not see the spider. Located on back/inner aspect of right elbow Associated symptoms are redness, swelling, soreness, and warmth and firmer area at bite location- can see two puncture areas of entry Modifying factors are cleaning the area Denies chest pain, shortness of breath, fevers, chills   Vaccines: out of date for Tetanus     Problems:  Patient Active Problem List   Diagnosis Date Noted   Encounter for general adult medical examination with abnormal findings 09/29/2022   Diabetes mellitus screening 09/29/2022   Encounter for lipid screening for cardiovascular disease 09/29/2022   Palpitations 09/29/2022   Constipation 09/29/2022   Normal labor 10/26/2018   Placental abruption 08/03/2018   ESOPHAGEAL REFLUX 05/18/2009    Allergies:  Allergies  Allergen Reactions   Latex Itching   Amoxil [Amoxicillin] Hives   Penicillins Hives   Medications:  Current Outpatient Medications:    fluticasone (FLONASE) 50 MCG/ACT nasal spray, Place 1 spray into both nostrils daily., Disp: , Rfl:    psyllium (METAMUCIL) 58.6 % powder, Take  1 packet by mouth 3 (three) times daily., Disp: , Rfl:    VELIVET 0.1/0.125/0.15 -0.025 MG tablet, Take 1 tablet by mouth daily., Disp: , Rfl:   Observations/Objective: Patient is well-developed, well-nourished in no acute distress.  Resting comfortably  at home.  Head is normocephalic, atraumatic.  No labored breathing.  Speech is clear and coherent with logical content.  Patient is alert  and oriented at baseline.  Inner right elbow red and noted area of bite in central aspect, with a white halo around the redness   Assessment and Plan:  1. Cellulitis of right upper extremity   2. Spider bite wound, accidental or unintentional, initial encounter   3. Vaccine counseling  Given the need for Tetanus vaccine and spider bite treatment Visit was stopped and she was advised to follow up with her PCP and or local Urgent Care today.  Reviewed side effects, risks and benefits of medication.    Patient acknowledged agreement and understanding of the plan.   Past Medical, Surgical, Social History, Allergies, and Medications have been Reviewed.     Follow Up Instructions: I discussed the assessment and treatment plan with the patient. The patient was provided an opportunity to ask questions and all were answered. The patient agreed with the plan and demonstrated an understanding of the instructions.  A copy of instructions were sent to the patient via MyChart unless otherwise noted below.    The patient was advised to call back or seek an in-person evaluation if the symptoms worsen or if the condition fails to improve as anticipated.  Time:  I spent 4 minutes with the patient via telehealth technology discussing the above problems/concerns.    Freddy Finner, NP

## 2022-10-25 NOTE — Patient Instructions (Signed)
  Freddye Rollene Fare, thank you for joining Freddy Finner, NP for today's virtual visit.  While this provider is not your primary care provider (PCP), if your PCP is located in our provider database this encounter information will be shared with them immediately following your visit.   A [facility] MyChart account gives you access to today's visit and all your visits, tests, and labs performed at [facility] " click here if you don't have a [facility] MyChart account or go to [email or web address]  Consent: (Patient) Jordan Ford provided verbal consent for this virtual visit at the beginning of the encounter.  Current Medications:  Current Outpatient Medications:    fluticasone (FLONASE) 50 MCG/ACT nasal spray, Place 1 spray into both nostrils daily., Disp: , Rfl:    psyllium (METAMUCIL) 58.6 % powder, Take 1 packet by mouth 3 (three) times daily., Disp: , Rfl:    VELIVET 0.1/0.125/0.15 -0.025 MG tablet, Take 1 tablet by mouth daily., Disp: , Rfl:    Medications ordered in this encounter:  No orders of the defined types were placed in this encounter.    *If you need refills on other medications prior to your next appointment, please contact your pharmacy*  Follow-Up: Call back or seek an in-person evaluation if the symptoms worsen or if the condition fails to improve as anticipated.  [facility] [phone]  Other Instructions Please call your PCP for updated vaccine and spider bite treatment Or a local URgent care if PCP can not see you today.   If you have been instructed to have an in-person evaluation today at a local Urgent Care facility, please use the link below. It will take you to a list of all of our available [facility], including address, phone number and hours of operation. Please do not delay care.  [facility]  If you or a family member do not have a primary care provider, use the link below to schedule a  visit and establish care. When you choose a [facility] primary care physician or advanced practice provider, you gain a long-term partner in health. Find a Primary Care Provider  Learn more about [facility] in-office and virtual care options:  - Get Care Now

## 2022-10-26 ENCOUNTER — Ambulatory Visit: Payer: Medicaid Other | Admitting: Internal Medicine

## 2023-03-29 ENCOUNTER — Telehealth: Payer: Medicaid Other | Admitting: Physician Assistant

## 2023-03-29 DIAGNOSIS — B9689 Other specified bacterial agents as the cause of diseases classified elsewhere: Secondary | ICD-10-CM | POA: Diagnosis not present

## 2023-03-29 DIAGNOSIS — J019 Acute sinusitis, unspecified: Secondary | ICD-10-CM | POA: Diagnosis not present

## 2023-03-29 MED ORDER — DOXYCYCLINE HYCLATE 100 MG PO TABS: 100.0000 mg | ORAL_TABLET | Freq: Two times a day (BID) | ORAL | 0 refills | Status: DC

## 2023-03-29 NOTE — Progress Notes (Signed)

## 2023-07-06 ENCOUNTER — Other Ambulatory Visit: Payer: Self-pay | Admitting: Obstetrics & Gynecology

## 2023-07-06 DIAGNOSIS — R928 Other abnormal and inconclusive findings on diagnostic imaging of breast: Secondary | ICD-10-CM

## 2023-07-10 ENCOUNTER — Encounter: Payer: Self-pay | Admitting: Nurse Practitioner

## 2023-07-20 LAB — HM PAP SMEAR: HPV, high-risk: NEGATIVE

## 2023-07-21 ENCOUNTER — Encounter

## 2023-07-21 ENCOUNTER — Other Ambulatory Visit

## 2023-07-23 ENCOUNTER — Other Ambulatory Visit: Payer: Self-pay | Admitting: Nurse Practitioner

## 2023-07-23 DIAGNOSIS — Z0001 Encounter for general adult medical examination with abnormal findings: Secondary | ICD-10-CM

## 2023-07-28 ENCOUNTER — Telehealth: Admitting: Family Medicine

## 2023-07-28 DIAGNOSIS — J029 Acute pharyngitis, unspecified: Secondary | ICD-10-CM | POA: Diagnosis not present

## 2023-07-28 NOTE — Progress Notes (Signed)
 E-Visit for Sore Throat  We are sorry that you are not feeling well.  Here is how we plan to help!  Your symptoms indicate a likely viral infection (Pharyngitis).   Pharyngitis is inflammation in the back of the throat which can cause a sore throat, scratchiness and sometimes difficulty swallowing.   Pharyngitis is typically caused by a respiratory virus and will just run its course.  Please keep in mind that your symptoms could last up to 10 days.  For throat pain, we recommend over the counter oral pain relief medications such as acetaminophen  or aspirin, or anti-inflammatory medications such as ibuprofen  or naproxen sodium.  Topical treatments such as oral throat lozenges or sprays may be used as needed.  Avoid close contact with loved ones, especially the very young and elderly.  Remember to wash your hands thoroughly throughout the day as this is the number one way to prevent the spread of infection and wipe down door knobs and counters with disinfectant.  After careful review of your answers, I would not recommend an antibiotic for your condition.  Antibiotics should not be used to treat conditions that we suspect are caused by viruses like the virus that causes the common cold or flu. However, some people can have Strep with atypical symptoms. You may need formal testing in clinic or office to confirm if your symptoms continue or worsen.  Providers prescribe antibiotics to treat infections caused by bacteria. Antibiotics are very powerful in treating bacterial infections when they are used properly.  To maintain their effectiveness, they should be used only when necessary.  Overuse of antibiotics has resulted in the development of super bugs that are resistant to treatment!    Home Care: Only take medications as instructed by your medical team. Do not drink alcohol while taking these medications. A steam or ultrasonic humidifier can help congestion.  You can place a towel over your head and  breathe in the steam from hot water coming from a faucet. Avoid close contacts especially the very young and the elderly. Cover your mouth when you cough or sneeze. Always remember to wash your hands.  Get Help Right Away If: You develop worsening fever or throat pain. You develop a severe head ache or visual changes. Your symptoms persist after you have completed your treatment plan.  Make sure you Understand these instructions. Will watch your condition. Will get help right away if you are not doing well or get worse.   Thank you for choosing an e-visit.  Your e-visit answers were reviewed by a board certified advanced clinical practitioner to complete your personal care plan. Depending upon the condition, your plan could have included both over the counter or prescription medications.  Please review your pharmacy choice. Make sure the pharmacy is open so you can pick up prescription now. If there is a problem, you may contact your provider through MyChart messaging and have the prescription routed to another phar2\macy.  Your safety is important to us . If you have drug allergies check your prescription carefully\\  For the next 24 hours you can use MyChart to ask questions about today's visit, request a non-urgent call back, or ask for a work or school excuse. You will get an email in the next two days asking about your experience. I hope that your e-visit has been valuable and will speed your recovery.    have provided 5 minutes of non face to face time during this encounter for chart review and documentation.

## 2023-08-02 ENCOUNTER — Other Ambulatory Visit (INDEPENDENT_AMBULATORY_CARE_PROVIDER_SITE_OTHER)

## 2023-08-02 ENCOUNTER — Other Ambulatory Visit: Payer: Self-pay | Admitting: Obstetrics & Gynecology

## 2023-08-02 ENCOUNTER — Ambulatory Visit
Admission: RE | Admit: 2023-08-02 | Discharge: 2023-08-02 | Disposition: A | Discharge status: Home / Self Care | Source: Ambulatory Visit | Attending: Obstetrics & Gynecology | Admitting: Obstetrics & Gynecology

## 2023-08-02 DIAGNOSIS — R928 Other abnormal and inconclusive findings on diagnostic imaging of breast: Secondary | ICD-10-CM

## 2023-08-02 DIAGNOSIS — Z0001 Encounter for general adult medical examination with abnormal findings: Secondary | ICD-10-CM

## 2023-08-02 DIAGNOSIS — Z0000 Encounter for general adult medical examination without abnormal findings: Secondary | ICD-10-CM | POA: Diagnosis not present

## 2023-08-02 LAB — COMPREHENSIVE METABOLIC PANEL WITH GFR
ALT: 20 U/L (ref 0–35)
AST: 25 U/L (ref 0–37)
Albumin: 4.4 g/dL (ref 3.5–5.2)
Alkaline Phosphatase: 58 U/L (ref 39–117)
BUN: 13 mg/dL (ref 6–23)
CO2: 25 meq/L (ref 19–32)
Calcium: 9.5 mg/dL (ref 8.4–10.5)
Chloride: 101 meq/L (ref 96–112)
Creatinine, Ser: 1.01 mg/dL (ref 0.40–1.20)
GFR: 71.95 mL/min (ref >60.00)
Glucose, Bld: 86 mg/dL (ref 70–99)
Potassium: 3.9 meq/L (ref 3.5–5.1)
Sodium: 135 meq/L (ref 135–145)
Total Bilirubin: 0.5 mg/dL (ref 0.2–1.2)
Total Protein: 7.7 g/dL (ref 6.0–8.3)

## 2023-08-02 LAB — CBC
HCT: 37.9 % (ref 36.0–46.0)
Hemoglobin: 13 g/dL (ref 12.0–15.0)
MCHC: 34.3 g/dL (ref 30.0–36.0)
MCV: 83.6 fl (ref 78.0–100.0)
Platelets: 277 10*3/uL (ref 150.0–400.0)
RBC: 4.53 Mil/uL (ref 3.87–5.11)
RDW: 13 % (ref 11.5–15.5)
WBC: 7.2 10*3/uL (ref 4.0–10.5)

## 2023-08-07 ENCOUNTER — Ambulatory Visit: Payer: Self-pay | Admitting: Family

## 2023-08-16 ENCOUNTER — Ambulatory Visit: Payer: Self-pay | Admitting: Family

## 2023-08-20 ENCOUNTER — Telehealth: Admitting: Family Medicine

## 2023-08-20 DIAGNOSIS — K219 Gastro-esophageal reflux disease without esophagitis: Secondary | ICD-10-CM | POA: Diagnosis not present

## 2023-08-20 MED ORDER — OMEPRAZOLE 20 MG PO CPDR: 20.0000 mg | DELAYED_RELEASE_CAPSULE | Freq: Every day | ORAL | 0 refills | Status: DC

## 2023-08-20 NOTE — Progress Notes (Signed)
 E-Visit for Heartburn  We are sorry that you are not feeling well.  Here is how we plan to help!  Based on what you shared with me it looks like you most likely have Gastroesophageal Reflux Disease (GERD)  Gastroesophageal reflux disease (GERD) happens when acid from your stomach flows up into the esophagus.  When acid comes in contact with the esophagus, the acid causes sorenss (inflammation) in the esophagus.  Over time, GERD may create small holes (ulcers) in the lining of the esophagus.  I have prescribed Omeprazole 20 mg one by mouth daily until you follow up with a provider.  Your symptoms should improve in the next day or two.  You can use antacids as needed until symptoms resolve.  Call us if your heartburn worsens, you have trouble swallowing, weight loss, spitting up blood or recurrent vomiting.  Home Care: May include lifestyle changes such as weight loss, quitting smoking and alcohol consumption Avoid foods and drinks that make your symptoms worse, such as: Caffeine or alcoholic drinks Chocolate Peppermint or mint flavorings Garlic and onions Spicy foods Citrus fruits, such as oranges, lemons, or limes Tomato-based foods such as sauce, chili, salsa and pizza Fried and fatty foods Avoid lying down for 3 hours prior to your bedtime or prior to taking a nap Eat small, frequent meals instead of a large meals Wear loose-fitting clothing.  Do not wear anything tight around your waist that causes pressure on your stomach. Raise the head of your bed 6 to 8 inches with wood blocks to help you sleep.  Extra pillows will not help.  Seek Help Right Away If: You have pain in your arms, neck, jaw, teeth or back Your pain increases or changes in intensity or duration You develop nausea, vomiting or sweating (diaphoresis) You develop shortness of breath or you faint Your vomit is green, yellow, black or looks like coffee grounds or blood Your stool is red, bloody or black  These  symptoms could be signs of other problems, such as heart disease, gastric bleeding or esophageal bleeding.  Make sure you : Understand these instructions. Will watch your condition. Will get help right away if you are not doing well or get worse.  Your e-visit answers were reviewed by a board certified advanced clinical practitioner to complete your personal care plan.  Depending on the condition, your plan could have included both over the counter or prescription medications.  If there is a problem please reply  once you have received a response from your provider.  Your safety is important to [place].  If you have drug allergies check your prescription carefully.    You can use MyChart to ask questions about today's visit, request a non-urgent call back, or ask for a work or school excuse for 24 hours related to this e-Visit. If it has been greater than 24 hours you will need to follow up with your provider, or enter a new e-Visit to address those concerns.  You will get an e-mail in the next two days asking about your experience.  I hope that your e-visit has been valuable and will speed your recovery. Thank you for using e-visits.   have provided 5 minutes of non face to face time during this encounter for chart review and documentation.

## 2023-09-04 ENCOUNTER — Ambulatory Visit: Admitting: Family Medicine

## 2023-09-04 ENCOUNTER — Encounter: Payer: Self-pay | Admitting: Family Medicine

## 2023-09-04 VITALS — BP 112/68 | HR 94 | Temp 98.3°F | Ht 63.0 in | Wt 132.6 lb

## 2023-09-04 DIAGNOSIS — R1013 Epigastric pain: Secondary | ICD-10-CM | POA: Insufficient documentation

## 2023-09-04 DIAGNOSIS — N182 Chronic kidney disease, stage 2 (mild): Secondary | ICD-10-CM | POA: Insufficient documentation

## 2023-09-04 DIAGNOSIS — R0989 Other specified symptoms and signs involving the circulatory and respiratory systems: Secondary | ICD-10-CM | POA: Diagnosis not present

## 2023-09-04 LAB — CBC WITH DIFFERENTIAL/PLATELET
Basophils Absolute: 0 K/uL (ref 0.0–0.1)
Basophils Relative: 0.5 % (ref 0.0–3.0)
Eosinophils Absolute: 0.4 K/uL (ref 0.0–0.7)
Eosinophils Relative: 5.6 % — ABNORMAL HIGH (ref 0.0–5.0)
HCT: 37.9 % (ref 36.0–46.0)
Hemoglobin: 12.8 g/dL (ref 12.0–15.0)
Lymphocytes Relative: 32.1 % (ref 12.0–46.0)
Lymphs Abs: 2.2 K/uL (ref 0.7–4.0)
MCHC: 33.9 g/dL (ref 30.0–36.0)
MCV: 84.6 fl (ref 78.0–100.0)
Monocytes Absolute: 0.4 K/uL (ref 0.1–1.0)
Monocytes Relative: 6.2 % (ref 3.0–12.0)
Neutro Abs: 3.8 K/uL (ref 1.4–7.7)
Neutrophils Relative %: 55.6 % (ref 43.0–77.0)
Platelets: 255 K/uL (ref 150.0–400.0)
RBC: 4.48 Mil/uL (ref 3.87–5.11)
RDW: 13.4 % (ref 11.5–15.5)
WBC: 6.9 K/uL (ref 4.0–10.5)

## 2023-09-04 LAB — COMPREHENSIVE METABOLIC PANEL WITH GFR
ALT: 29 U/L (ref 0–35)
AST: 30 U/L (ref 0–37)
Albumin: 4.4 g/dL (ref 3.5–5.2)
Alkaline Phosphatase: 56 U/L (ref 39–117)
BUN: 8 mg/dL (ref 6–23)
CO2: 28 meq/L (ref 19–32)
Calcium: 9.5 mg/dL (ref 8.4–10.5)
Chloride: 102 meq/L (ref 96–112)
Creatinine, Ser: 0.86 mg/dL (ref 0.40–1.20)
GFR: 87.21 mL/min (ref >60.00)
Glucose, Bld: 85 mg/dL (ref 70–99)
Potassium: 3.7 meq/L (ref 3.5–5.1)
Sodium: 138 meq/L (ref 135–145)
Total Bilirubin: 0.5 mg/dL (ref 0.2–1.2)
Total Protein: 7.3 g/dL (ref 6.0–8.3)

## 2023-09-04 LAB — LIPASE: Lipase: 19 U/L (ref 11.0–59.0)

## 2023-09-04 MED ORDER — OMEPRAZOLE 20 MG PO CPDR: 20.0000 mg | DELAYED_RELEASE_CAPSULE | Freq: Two times a day (BID) | ORAL | 3 refills | Status: DC

## 2023-09-04 NOTE — Progress Notes (Signed)
 Acute Office Visit  Subjective:     Patient ID: Madalyn DELENA Grates, female    DOB: January 02, 1986, 38 y.o.   MRN: [id number]  No chief complaint on file.   HPI  Discussed the use of AI scribe software for clinical note transcription with the patient, who gave verbal consent to proceed.  History of Present Illness Letty A Simeone is a 38 year old female who presents with persistent stomach pain and reflux symptoms.  Upper abdominal pain and gastroesophageal reflux symptoms - Persistent upper abdominal pain and reflux symptoms since March - Sensation of food being stuck in the throat, unrelieved by water - No choking episodes - Initial symptoms caused significant emotional distress - Omeprazole  20 mg daily has improved pain, but dull achiness persists for several hours after eating - Pain managed with heating pad and hot tea  Gastrointestinal symptoms and dietary modifications - Alternating constipation and diarrhea, particularly after consuming dairy products - Eliminated milk and ice cream from diet due to gastrointestinal symptoms - Watermelon and zucchini poorly tolerated during pain episodes - Dietary modifications include avoiding spicy and heavy foods, opting for bland foods such as toast and potatoes - Primarily drinks water, occasionally sweet tea, avoids sodas, and adds minimal salt to food  Medication and supplement changes - Discontinued digestive enzymes and horse chestnut supplements due to persistent pain - Recently stopped birth control, suspecting it may contribute to gastrointestinal symptoms  ROS Per HPI     Objective:    BP 112/68 (BP Location: Left Arm, Patient Position: Sitting)   Pulse 94   Temp 98.3 F (36.8 C) (Temporal)   Ht 5' 3 (1.6 m)   Wt 132 lb 9.6 oz (60.1 kg)   SpO2 98%   BMI 23.49 kg/m    Physical Exam Vitals and nursing note reviewed.  Constitutional:      General: She is not in acute distress.    Appearance: Normal appearance.  She is normal weight.  HENT:     Head: Normocephalic and atraumatic.     Right Ear: External ear normal.     Left Ear: External ear normal.     Nose: Nose normal.     Mouth/Throat:     Mouth: Mucous membranes are moist.     Pharynx: Oropharynx is clear.  Eyes:     Extraocular Movements: Extraocular movements intact.     Pupils: Pupils are equal, round, and reactive to light.  Cardiovascular:     Rate and Rhythm: Normal rate and regular rhythm.     Pulses: Normal pulses.     Heart sounds: Normal heart sounds.  Pulmonary:     Effort: Pulmonary effort is normal. No respiratory distress.     Breath sounds: Normal breath sounds. No wheezing, rhonchi or rales.  Abdominal:     General: Bowel sounds are normal. There is no distension.     Palpations: Abdomen is soft. There is no mass.     Tenderness: There is no abdominal tenderness. There is no guarding or rebound.     Hernia: No hernia is present.  Musculoskeletal:        General: Normal range of motion.     Cervical back: Normal range of motion.     Right lower leg: No edema.     Left lower leg: No edema.  Lymphadenopathy:     Cervical: No cervical adenopathy.  Neurological:     General: No focal deficit present.     Mental Status: She  is alert and oriented to person, place, and time.  Psychiatric:        Mood and Affect: Mood normal.        Thought Content: Thought content normal.     No results found for any visits on 09/04/23.      Assessment & Plan:   Assessment and Plan Assessment & Plan Gastroesophageal Reflux Disease (GERD) Symptoms improved with omeprazole  but some discomfort persists. Differential includes peptic ulcer disease and H. pylori infection. - Increase omeprazole  to 20 mg twice daily if needed. - Order H. pylori breath test. - Order blood tests for pancreatic enzymes and other relevant labs. - Advise dietary modifications to avoid spicy and heavy foods. - Consider gastroenterology referral for  endoscopy if symptoms worsen or persist.  Sodium level at lower end of normal. Low sodium intake and lack of electrolyte fluids may contribute. - Recheck sodium levels with blood tests. - Advise adding a small amount of salt to her diet. - Consider electrolyte-containing fluids if sodium levels remain low.  Chronic Kidney Disease Stage 2 GFR is 71, consistent with stage 2 CKD. No immediate intervention unless GFR declines. - Monitor GFR and kidney function with periodic blood tests. - Advise maintaining good hydration and monitoring blood pressure and blood sugar levels.  General Health Maintenance Stopped birth control due to stomach concerns. - Remove birth control from medication list. - Advise maintaining a balanced diet and adequate hydration.   Meds ordered this encounter  Medications   omeprazole  (PRILOSEC) 20 MG capsule    Sig: Take 1 capsule (20 mg total) by mouth 2 (two) times daily before a meal.    Dispense:  60 capsule    Refill:  3    Return if symptoms worsen or fail to improve.  Corean LITTIE Ku, FNP

## 2023-09-04 NOTE — Patient Instructions (Addendum)
 We are checking labs today, will be in contact with any results that require further attention.  Increase omeprazole  20mg  twice a day.  If symptoms persist, will consider GI referral

## 2023-09-05 ENCOUNTER — Ambulatory Visit: Payer: Self-pay | Admitting: Family Medicine

## 2023-09-05 DIAGNOSIS — K5909 Other constipation: Secondary | ICD-10-CM

## 2023-09-05 DIAGNOSIS — R0989 Other specified symptoms and signs involving the circulatory and respiratory systems: Secondary | ICD-10-CM

## 2023-09-05 DIAGNOSIS — K2100 Gastro-esophageal reflux disease with esophagitis, without bleeding: Secondary | ICD-10-CM

## 2023-09-05 DIAGNOSIS — R1013 Epigastric pain: Secondary | ICD-10-CM

## 2023-09-05 LAB — H. PYLORI BREATH TEST: H. pylori Breath Test: NOT DETECTED

## 2023-09-12 ENCOUNTER — Other Ambulatory Visit: Payer: Self-pay

## 2023-09-12 DIAGNOSIS — K2100 Gastro-esophageal reflux disease with esophagitis, without bleeding: Secondary | ICD-10-CM

## 2023-09-12 DIAGNOSIS — R1013 Epigastric pain: Secondary | ICD-10-CM

## 2023-09-13 ENCOUNTER — Encounter: Payer: Self-pay | Admitting: Physician Assistant

## 2023-09-22 ENCOUNTER — Ambulatory Visit: Admitting: Nurse Practitioner

## 2023-10-10 ENCOUNTER — Ambulatory Visit: Admitting: Allergy & Immunology

## 2023-10-10 ENCOUNTER — Encounter: Payer: Self-pay | Admitting: Allergy & Immunology

## 2023-10-10 ENCOUNTER — Other Ambulatory Visit: Payer: Self-pay

## 2023-10-10 VITALS — BP 118/62 | HR 74 | Temp 97.8°F | Resp 18 | Ht 62.21 in | Wt 131.2 lb

## 2023-10-10 DIAGNOSIS — T7805XD Anaphylactic reaction due to tree nuts and seeds, subsequent encounter: Secondary | ICD-10-CM | POA: Diagnosis not present

## 2023-10-10 DIAGNOSIS — J310 Chronic rhinitis: Secondary | ICD-10-CM | POA: Diagnosis not present

## 2023-10-10 DIAGNOSIS — R1310 Dysphagia, unspecified: Secondary | ICD-10-CM

## 2023-10-10 DIAGNOSIS — T7805XA Anaphylactic reaction due to tree nuts and seeds, initial encounter: Secondary | ICD-10-CM

## 2023-10-10 NOTE — Patient Instructions (Addendum)
 1. Dysphagia - Because of insurance stipulations, we cannot do skin testing on the same day as your first visit. - We are all working to fight this, but for now we need to do two separate visits.  - We will know more after we do testing at the next visit.  - The skin testing visit can be squeezed in at your convenience.  - Then we can make a more full plan to address all of your symptoms. - Be sure to stop your antihistamines for 3 days before this appointment.  - There is a the low positive predictive value of food allergy testing and hence the high possibility of false positives. - In contrast, food allergy testing has a high negative predictive value, therefore if testing is negative we can be relatively assured that they are indeed negative.   2. Chronic rhinitis - We will do environmental allergy testing at the next visit.  - Stop antihistamines for 3 days before the next visit.   3. Return in about 2 months (around 12/10/2023) for ALLERGY TESTING (1-55 + 1-17 + select foods). You can have the follow up appointment with Dr. Iva or a Nurse Practicioner (our Nurse Practitioners are excellent and always have Physician oversight!).    Please inform us  of any Emergency Department visits, hospitalizations, or changes in symptoms. Call us  before going to the ED for breathing or allergy symptoms since we might be able to fit you in for a sick visit. Feel free to contact us  anytime with any questions, problems, or concerns.  It was a pleasure to meet you and your family today!  Websites that have reliable patient information: 1. American Academy of Asthma, Allergy, and Immunology: www.aaaai.org 2. Food Allergy Research and Education (FARE): foodallergy.org 3. Mothers of Asthmatics: http://www.asthmacommunitynetwork.org 4. American College of Allergy, Asthma, and Immunology: www.acaai.org      "Like" us  on Facebook and Instagram for our latest updates!      A healthy democracy  works best when [employer] participate! Make sure you are registered to vote! If you have moved or changed any of your contact information, you will need to get this updated before voting! Scan the QR codes below to learn more!

## 2023-10-10 NOTE — Progress Notes (Signed)
 NEW PATIENT  Date of Service/Encounter:  10/10/23  Consult requested by: Elnor Lauraine BRAVO, NP   Assessment:   Dysphagia  Chronic rhinitis - planning for skin testing at the next visit  Throat swelling - with cashew  Plan/Recommendations:   1. Dysphagia - Because of insurance stipulations, we cannot do skin testing on the same day as your first visit. - We are all working to fight this, but for now we need to do two separate visits.  - We will know more after we do testing at the next visit.  - The skin testing visit can be squeezed in at your convenience.  - Then we can make a more full plan to address all of your symptoms. - Be sure to stop your antihistamines for 3 days before this appointment.  - There is a the low positive predictive value of food allergy testing and hence the high possibility of false positives. - In contrast, food allergy testing has a high negative predictive value, therefore if testing is negative we can be relatively assured that they are indeed negative.   2. Chronic rhinitis - We will do environmental allergy testing at the next visit.  - Stop antihistamines for 3 days before the next visit.   3. Return in about 2 months (around 12/10/2023) for ALLERGY TESTING (1-55 + 1-17 + select foods). You can have the follow up appointment with Dr. Iva or a Nurse Practicioner (our Nurse Practitioners are excellent and always have Physician oversight!).    This note in its entirety was forwarded to the Provider who requested this consultation.  Subjective:   Jordan Ford is a 38 y.o. female presenting today for evaluation of  Chief Complaint  Patient presents with   stomach issue    Had since April and has gotten worse.,bloating, diarrhea,constipation    Jordan Ford has a history of the following: Patient Active Problem List   Diagnosis Date Noted   Epigastric pain 09/04/2023   Choking sensation 09/04/2023   CKD (chronic kidney disease)  stage 2, GFR 60-89 ml/min 09/04/2023   Encounter for general adult medical examination with abnormal findings 09/29/2022   Diabetes mellitus screening 09/29/2022   Encounter for lipid screening for cardiovascular disease 09/29/2022   Palpitations 09/29/2022   Constipation 09/29/2022   Normal labor 10/26/2018   Placental abruption 08/03/2018   ESOPHAGEAL REFLUX 05/18/2009    History obtained from: chart review and patient.  Discussed the use of AI scribe software for clinical note transcription with the patient and/or guardian, who gave verbal consent to proceed.  Jordan Ford was referred by Elnor Lauraine BRAVO, NP.     Jordan Ford is a 38 y.o. female presenting for an evaluation of environmental allergies.  Allergic Rhinitis Symptom History: She has not had any sinus surgeries or allergy testing in the past. She experiences mild environmental allergies to dust. She has hearing issues related to congestion and has been advised to use Mucinex or Sudafed to help relieve these symptoms. She has not used nasal sprays for these symptoms.  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 She avoids dairy due to previous diarrhea and attempts to remain dairy-free. She has not undergone allergy testing before.  GERD/GI Symptom History: She experiences gastrointestinal symptoms, including a sensation of food being stuck, severe stomach pain, bloating, and alternating diarrhea and constipation. Omeprazole  alleviated the stomach pain completely, but bloating persists,  especially after consuming bread and salad. She recalls seeing a stomach doctor in high school for stress-related stomach pain but has not had similar issues since then.   She lives in [place] with her children, including a four-year-old, and has a dog that stays outside due to her son's allergic reactions.  Otherwise, there is no history of other atopic diseases,  including drug allergies, stinging insect allergies, or contact dermatitis. There is no significant infectious history. Vaccinations are up to date.    Past Medical History: Patient Active Problem List   Diagnosis Date Noted   Epigastric pain 09/04/2023   Choking sensation 09/04/2023   CKD (chronic kidney disease) stage 2, GFR 60-89 ml/min 09/04/2023   Encounter for general adult medical examination with abnormal findings 09/29/2022   Diabetes mellitus screening 09/29/2022   Encounter for lipid screening for cardiovascular disease 09/29/2022   Palpitations 09/29/2022   Constipation 09/29/2022   Normal labor 10/26/2018   Placental abruption 08/03/2018   ESOPHAGEAL REFLUX 05/18/2009    Medication List:  Allergies as of 10/10/2023       Reactions   Latex Itching   Amoxil [amoxicillin] Hives   Penicillins Hives        Medication List        Accurate as of October 10, 2023 11:59 PM. If you have any questions, ask your nurse or doctor.          fluticasone 50 MCG/ACT nasal spray Commonly known as: FLONASE Place 1 spray into both nostrils daily.   Horse Chestnut 300 MG Caps Take 250 capsules by mouth daily.   omeprazole  20 MG capsule Commonly known as: PRILOSEC Take 1 capsule (20 mg total) by mouth 2 (two) times daily before a meal.   psyllium 58.6 % powder Commonly known as: METAMUCIL Take 1 packet by mouth 3 (three) times daily.        Birth History: non-contributory  Developmental History: non-contributory  Past Surgical History: Past Surgical History:  Procedure Laterality Date   MYRINGOTOMY     TYMPANOSTOMY TUBE PLACEMENT       Family History: Family History  Problem Relation Age of Onset   Hypertension Father    Mental retardation Maternal Aunt        downs   Diabetes Maternal Grandmother    Heart disease Maternal Grandfather    Cancer Paternal Grandmother        breast     Social History: Jordan Ford lives at home with her family.  They  live in a house that is 36 years old.  There is 1 throughout the home.  They have a heat pump for heating and cooling.  There is a dog outside of the home.  It has been around for 8 years.  There are no dust mite covers on the bedding.  There is no tobacco exposure.  She currently works as a Nurse, learning disability and other events.  She does live near [address].  There is no HEPA filter in the home.  She does not have any fume, chemical, or dust exposure.   Review of systems otherwise negative other than that mentioned in the HPI.    Objective:   Blood pressure 118/62, pulse 74, temperature 97.8 F (36.6 C), temperature source Temporal, resp. rate 18, height 5' 2.21 (1.58 m), weight 131 lb 3.2 oz (59.5 kg), SpO2 99%, unknown if currently breastfeeding. Body mass index is 23.84 kg/m.     Physical Exam Vitals reviewed.  Constitutional:  Appearance: She is well-developed.  HENT:     Head: Normocephalic and atraumatic.     Right Ear: Tympanic membrane, ear canal and external ear normal. No drainage, swelling or tenderness. Tympanic membrane is not injected, scarred, erythematous, retracted or bulging.     Left Ear: Tympanic membrane, ear canal and external ear normal. No drainage, swelling or tenderness. Tympanic membrane is not injected, scarred, erythematous, retracted or bulging.     Nose: No nasal deformity, septal deviation, mucosal edema or rhinorrhea.     Right Turbinates: Enlarged, swollen and pale.     Left Turbinates: Enlarged, swollen and pale.     Right Sinus: No maxillary sinus tenderness or frontal sinus tenderness.     Left Sinus: No maxillary sinus tenderness or frontal sinus tenderness.     Mouth/Throat:     Mouth: Mucous membranes are not pale and not dry.     Pharynx: Uvula midline.  Eyes:     General:        Right eye: No discharge.        Left eye: No discharge.     Conjunctiva/sclera: Conjunctivae normal.     Right eye: Right conjunctiva is not  injected. No chemosis.    Left eye: Left conjunctiva is not injected. No chemosis.    Pupils: Pupils are equal, round, and reactive to light.  Cardiovascular:     Rate and Rhythm: Normal rate and regular rhythm.     Heart sounds: Normal heart sounds.  Pulmonary:     Effort: Pulmonary effort is normal. No tachypnea, accessory muscle usage or respiratory distress.     Breath sounds: Normal breath sounds. No wheezing, rhonchi or rales.     Comments: Moving air well in all lung fields.  No increased work of breathing. Chest:     Chest wall: No tenderness.  Abdominal:     Tenderness: There is no abdominal tenderness. There is no guarding or rebound.  Lymphadenopathy:     Head:     Right side of head: No submandibular, tonsillar or occipital adenopathy.     Left side of head: No submandibular, tonsillar or occipital adenopathy.     Cervical: No cervical adenopathy.  Skin:    General: Skin is warm.     Capillary Refill: Capillary refill takes less than 2 seconds.     Coloration: Skin is not pale.     Findings: No abrasion, erythema, petechiae or rash. Rash is not papular, urticarial or vesicular.     Comments: No eczematous or urticarial lesions noted.  Neurological:     Mental Status: She is alert.  Psychiatric:        Behavior: Behavior is cooperative.      Diagnostic studies: deferred due to insurance stipulations that require a separate visit for testing          Marty Shaggy, MD [facility] 

## 2023-10-12 ENCOUNTER — Encounter: Payer: Self-pay | Admitting: Allergy & Immunology

## 2023-11-07 ENCOUNTER — Ambulatory Visit: Admitting: Physician Assistant

## 2023-11-07 ENCOUNTER — Encounter: Payer: Self-pay | Admitting: Physician Assistant

## 2023-12-12 ENCOUNTER — Ambulatory Visit: Admitting: Allergy & Immunology

## 2023-12-12 DIAGNOSIS — J302 Other seasonal allergic rhinitis: Secondary | ICD-10-CM | POA: Diagnosis not present

## 2023-12-12 DIAGNOSIS — J3089 Other allergic rhinitis: Secondary | ICD-10-CM | POA: Diagnosis not present

## 2023-12-12 DIAGNOSIS — T7800XD Anaphylactic reaction due to unspecified food, subsequent encounter: Secondary | ICD-10-CM

## 2023-12-12 DIAGNOSIS — T7800XA Anaphylactic reaction due to unspecified food, initial encounter: Secondary | ICD-10-CM

## 2023-12-12 NOTE — Progress Notes (Unsigned)
 FOLLOW UP  Date of Service/Encounter:  12/12/23   Assessment:   Dysphagia   Perennial and seasonal allergic rhinitis ()grasses, ragweed, weeds, trees, indoor molds, outdoor molds, dust mites, cat, dog, horse, and mixed feathers)   Throat swelling - with cashew, getting clarify information with labs today  Plan/Recommendations:   1. Dysphagia - Testing was positive to sesame, milk, and tree nuts. - We are going to get blood work to confirm this.  - We are getting blood work to confirm this.  - Emergency action plan provided. - EpiPen  training reviewed.   2. Chronic rhinitis - Testing today showed: grasses, ragweed, weeds, trees, indoor molds, outdoor molds, dust mites, cat, dog, horse, and mixed feathers - Copy of test results provided.  - Avoidance measures provided. - Continue with: Zyrtec (cetirizine) 10mg  tablet once daily and Flonase (fluticasone) two sprays per nostril daily (AIM FOR EAR ON EACH SIDE) - Start taking: Astelin (azelastine) 2 sprays per nostril 1-2 times daily as needed - You can use an extra dose of the antihistamine, if needed, for breakthrough symptoms.  - Consider nasal saline rinses 1-2 times daily to remove allergens from the nasal cavities as well as help with mucous clearance (this is especially helpful to do before the nasal sprays are given) - Consider allergy shots as a means of long-term control. - Allergy shots re-train and reset the immune system to ignore environmental allergens and decrease the resulting immune response to those allergens (sneezing, itchy watery eyes, runny nose, nasal congestion, etc).    - Allergy shots improve symptoms in 75-85% of patients.  - We can discuss more at the next appointment if the medications are not working for you.  3. Return in about 3 months (around 03/13/2024). You can have the follow up appointment with Dr. Iva or a Nurse Practicioner (our Nurse Practitioners are excellent and always have  Physician oversight!).    Subjective:   Jordan Ford is a 38 y.o. female presenting today for follow up of No chief complaint on file.   Jordan Ford has a history of the following: Patient Active Problem List   Diagnosis Date Noted   Epigastric pain 09/04/2023   Choking sensation 09/04/2023   CKD (chronic kidney disease) stage 2, GFR 60-89 ml/min 09/04/2023   Encounter for general adult medical examination with abnormal findings 09/29/2022   Diabetes mellitus screening 09/29/2022   Encounter for lipid screening for cardiovascular disease 09/29/2022   Palpitations 09/29/2022   Constipation 09/29/2022   Normal labor 10/26/2018   Placental abruption 08/03/2018   ESOPHAGEAL REFLUX 05/18/2009    History obtained from: chart review and patient.  Discussed the use of AI scribe software for clinical note transcription with the patient and/or guardian, who gave verbal consent to proceed.  Jase is a 39 y.o. female presenting for skin testing. She was last seen on August 2025. We could not do testing because her insurance company does not cover testing on the same day as a New Patient visit. She has been off of all antihistamines 3 days in anticipation of the testing.   At that visit, we decided to do testing to the most common food allergens as well as environmental allergens.  Otherwise, there have been no changes to her past medical history, surgical history, family history, or social history.    Review of systems otherwise negative other than that mentioned in the HPI.    Objective:   unknown if currently breastfeeding. There is no height  or weight on file to calculate BMI.    Physical exam deferred since this was a skin testing appointment only.   Diagnostic studies:   Allergy Studies:     Airborne Adult Perc - 12/12/23 1406     Time Antigen Placed 1406    Allergen Manufacturer Jestine    Number of Test 55    1. Control-Buffer 50% Glycerol Negative    2.  Control-Histamine 2+    3. [place] 2+    4. [place] Negative    5. Johnson Negative    6. [place]  Blue 4+    7. Meadow Fescue 4+    8. Perennial Rye 4+    9. Timothy 4+    10. Ragweed Mix Negative    11. Cocklebur Negative    12. Plantain,  English 2+    13. Baccharis 2+    14. Dog Fennel 2+    15. Russian Thistle 3+    16. Lamb's Quarters 3+    17. Sheep Sorrell Negative    18. Rough Pigweed Negative    19. Marsh Elder, Rough Negative    20. Mugwort, Common Negative    21. Box, Elder 2+    22. Cedar, red 3+    23. Sweet Gum 4+    24. Pecan Pollen 3+    25. Pine Mix 3+    26. Walnut, Black Pollen 4+    27. Red Mulberry 3+    28. Ash Mix 4+    29. Birch Mix 4+    30. Beech American 4+    31. Cottonwood, [place] 2+    32. Hickory, White 4+    33. Maple Mix 3+    34. Oak, [place] Mix 4+    35. Sycamore Eastern 3+    36. Alternaria Alternata 2+    37. Cladosporium Herbarum 3+    38. Aspergillus Mix 2+    39. Penicillium Mix Negative    40. Bipolaris Sorokiniana (Helminthosporium) Negative    41. Drechslera Spicifera (Curvularia) Negative    42. Mucor Plumbeus Negative    43. Fusarium Moniliforme 2+    44. Aureobasidium Pullulans (pullulara) 2+    45. Rhizopus Oryzae Negative    46. Botrytis Cinera Negative    47. Epicoccum Nigrum 2+    48. Phoma Betae 2+    49. Dust Mite Mix 4+    50. Cat Hair 10,000 BAU/ml 3+    51.  Dog Epithelia 2+    52. Mixed Feathers 2+    53. Horse Epithelia 2+    54. Cockroach, German Negative    55. Tobacco Leaf Negative          Intradermal - 12/12/23 1511     Time Antigen Placed 1500    Allergen Manufacturer Jestine    Location Arm    Number of Test 6    Control Negative    [place] 2+    Johnson 3+    7 Grass 4+    Mold 3 Negative    Cockroach Negative          Food Adult Perc - 12/12/23 1400     Time Antigen Placed 1406    Allergen Manufacturer Jestine    Location Back    Number of allergen test 17    1. Peanut  Negative    2. Soybean Negative    3. Wheat Negative    4. Sesame --   4 x 11   5. Milk, Cow Negative  6. Casein --   8 x 15   7. Egg White, Chicken Negative    8. Shellfish Mix Negative    9. Fish Mix Negative    10. Cashew --   6 x 10   11. Walnut Food Negative    12. Almond --   7 x 18   13. Hazelnut --   5 x 10   14. Pecan Food Negative    15. Pistachio --   1- x 25   16. [place] Nut Negative    17. Coconut Negative          Allergy testing results were read and interpreted by myself, documented by clinical staff.      Marty Shaggy, MD  [facility] 

## 2023-12-12 NOTE — Patient Instructions (Signed)
 1. Dysphagia - Testing was positive to sesame, milk, and tree nuts. - We are going to get blood work to confirm this.  - We are getting blood work to confirm this.  - Emergency action plan provided. - EpiPen  training reviewed.   2. Chronic rhinitis - Testing today showed: grasses, ragweed, weeds, trees, indoor molds, outdoor molds, dust mites, cat, dog, horse, and mixed feathers - Copy of test results provided.  - Avoidance measures provided. - Continue with: Zyrtec (cetirizine) 10mg  tablet once daily and Flonase (fluticasone) two sprays per nostril daily (AIM FOR EAR ON EACH SIDE) - Start taking: Astelin (azelastine) 2 sprays per nostril 1-2 times daily as needed - You can use an extra dose of the antihistamine, if needed, for breakthrough symptoms.  - Consider nasal saline rinses 1-2 times daily to remove allergens from the nasal cavities as well as help with mucous clearance (this is especially helpful to do before the nasal sprays are given) - Consider allergy shots as a means of long-term control. - Allergy shots re-train and reset the immune system to ignore environmental allergens and decrease the resulting immune response to those allergens (sneezing, itchy watery eyes, runny nose, nasal congestion, etc).    - Allergy shots improve symptoms in 75-85% of patients.  - We can discuss more at the next appointment if the medications are not working for you.  3. Return in about 3 months (around 03/13/2024). You can have the follow up appointment with Dr. Iva or a Nurse Practicioner (our Nurse Practitioners are excellent and always have Physician oversight!).    Please inform us  of any Emergency Department visits, hospitalizations, or changes in symptoms. Call us  before going to the ED for breathing or allergy symptoms since we might be able to fit you in for a sick visit. Feel free to contact us  anytime with any questions, problems, or concerns.  It was a pleasure to see you and  your family again today!  Websites that have reliable patient information: 1. American Academy of Asthma, Allergy, and Immunology: www.aaaai.org 2. Food Allergy Research and Education (FARE): foodallergy.org 3. Mothers of Asthmatics: http://www.asthmacommunitynetwork.org 4. American College of Allergy, Asthma, and Immunology: www.acaai.org      "Like" us  on Facebook and Instagram for our latest updates!      A healthy democracy works best when [employer] participate! Make sure you are registered to vote! If you have moved or changed any of your contact information, you will need to get this updated before voting! Scan the QR codes below to learn more!        Airborne Adult Perc - 12/12/23 1406     Time Antigen Placed 1406    Allergen Manufacturer Jestine    Number of Test 55    1. Control-Buffer 50% Glycerol Negative    2. Control-Histamine 2+    3. [place]ia 2+    4. [place] Negative    5. Johnson Negative    6. [place]  Blue 4+    7. Meadow Fescue 4+    8. Perennial Rye 4+    9. Timothy 4+    10. Ragweed Mix Negative    11. Cocklebur Negative    12. Plantain,  English 2+    13. Baccharis 2+    14. Dog Fennel 2+    15. Russian Thistle 3+    16. Lamb's Quarters 3+    17. Sheep Sorrell Negative    18. Rough Pigweed Negative    19. Issac Ponto,  Rough Negative    20. Mugwort, Common Negative    21. Box, Elder 2+    22. Cedar, red 3+    23. Sweet Gum 4+    24. Pecan Pollen 3+    25. Pine Mix 3+    26. Walnut, Black Pollen 4+    27. Red Mulberry 3+    28. Ash Mix 4+    29. Birch Mix 4+    30. Beech American 4+    31. Cottonwood, [place] 2+    32. Hickory, White 4+    33. Maple Mix 3+    34. Oak, [place] Mix 4+    35. Sycamore Eastern 3+    36. Alternaria Alternata 2+    37. Cladosporium Herbarum 3+    38. Aspergillus Mix 2+    39. Penicillium Mix Negative    40. Bipolaris Sorokiniana (Helminthosporium) Negative    41. Drechslera Spicifera (Curvularia) Negative     42. Mucor Plumbeus Negative    43. Fusarium Moniliforme 2+    44. Aureobasidium Pullulans (pullulara) 2+    45. Rhizopus Oryzae Negative    46. Botrytis Cinera Negative    47. Epicoccum Nigrum 2+    48. Phoma Betae 2+    49. Dust Mite Mix 4+    50. Cat Hair 10,000 BAU/ml 3+    51.  Dog Epithelia 2+    52. Mixed Feathers 2+    53. Horse Epithelia 2+    54. Cockroach, German Negative    55. Tobacco Leaf Negative          Intradermal - 12/12/23 1511     Time Antigen Placed 1500    Allergen Manufacturer Jestine    Location Arm    Number of Test 6    Control Negative    [place] 2+    Johnson 3+    7 Grass 4+    Mold 3 Negative    Cockroach Negative          Food Adult Perc - 12/12/23 1400     Time Antigen Placed 1406    Allergen Manufacturer Jestine    Location Back    Number of allergen test 17    1. Peanut Negative    2. Soybean Negative    3. Wheat Negative    4. Sesame --   4 x 11   5. Milk, Cow Negative    6. Casein --   8 x 15   7. Egg White, Chicken Negative    8. Shellfish Mix Negative    9. Fish Mix Negative    10. Cashew --   6 x 10   11. Walnut Food Negative    12. Almond --   7 x 18   13. Hazelnut --   5 x 10   14. Pecan Food Negative    15. Pistachio --   1- x 25   16. [place] Nut Negative    17. Coconut Negative          Reducing Pollen Exposure  The American Academy of Allergy, Asthma and Immunology suggests the following steps to reduce your exposure to pollen during allergy seasons.    Do not hang sheets or clothing out to dry; pollen may collect on these items. Do not mow lawns or spend time around freshly cut grass; mowing stirs up pollen. Keep windows closed at night.  Keep car windows closed while driving. Minimize morning activities outdoors, a time when pollen counts are  usually at their highest. Stay indoors as much as possible when pollen counts or humidity is high and on windy days when pollen tends to remain in the air  longer. Use air conditioning when possible.  Many air conditioners have filters that trap the pollen spores. Use a HEPA room air filter to remove pollen form the indoor air you breathe.   Control of Mold Allergen   Mold and fungi can grow on a variety of surfaces provided certain temperature and moisture conditions exist.  Outdoor molds grow on plants, decaying vegetation and soil.  The major outdoor mold, Alternaria and Cladosporium, are found in very high numbers during hot and dry conditions.  Generally, a late Lamesha - Fall peak is seen for common outdoor fungal spores.  Rain will temporarily lower outdoor mold spore count, but counts rise rapidly when the rainy period ends.  The most important indoor molds are Aspergillus and Penicillium.  Dark, humid and poorly ventilated basements are ideal sites for mold growth.  The next most common sites of mold growth are the bathroom and the kitchen.  Outdoor (Seasonal) Mold Control   Use air conditioning and keep windows closed Avoid exposure to decaying vegetation. Avoid leaf raking. Avoid grain handling. Consider wearing a face mask if working in moldy areas.    Indoor (Perennial) Mold Control    Maintain humidity below 50%. Clean washable surfaces with 5% bleach solution. Remove sources e.g. contaminated carpets.    Control of Dog or Cat Allergen  Avoidance is the best way to manage a dog or cat allergy. If you have a dog or cat and are allergic to dog or cats, consider removing the dog or cat from the home. If you have a dog or cat but don't want to find it a new home, or if your family wants a pet even though someone in the household is allergic, here are some strategies that may help keep symptoms at bay:  Keep the pet out of your bedroom and restrict it to only a few rooms. Be advised that keeping the dog or cat in only one room will not limit the allergens to that room. Don't pet, hug or kiss the dog or cat; if you do, wash your  hands with soap and water. High-efficiency particulate air (HEPA) cleaners run continuously in a bedroom or living room can reduce allergen levels over time. Regular use of a high-efficiency vacuum cleaner or a central vacuum can reduce allergen levels. Giving your dog or cat a bath at least once a week can reduce airborne allergen.  Control of Dust Mite Allergen    Dust mites play a major role in allergic asthma and rhinitis.  They occur in environments with high humidity wherever human skin is found.  Dust mites absorb humidity from the atmosphere (ie, they do not drink) and feed on organic matter (including shed human and animal skin).  Dust mites are a microscopic type of insect that you cannot see with the naked eye.  High levels of dust mites have been detected from mattresses, pillows, carpets, upholstered furniture, bed covers, clothes, soft toys and any woven material.  The principal allergen of the dust mite is found in its feces.  A gram of dust may contain 1,000 mites and 250,000 fecal particles.  Mite antigen is easily measured in the air during house cleaning activities.  Dust mites do not bite and do not cause harm to humans, other than by triggering allergies/asthma.    Ways  to decrease your exposure to dust mites in your home:  Encase mattresses, box springs and pillows with a mite-impermeable barrier or cover   Wash sheets, blankets and drapes weekly in hot water (130 F) with detergent and dry them in a dryer on the hot setting.  Have the room cleaned frequently with a vacuum cleaner and a damp dust-mop.  For carpeting or rugs, vacuuming with a vacuum cleaner equipped with a high-efficiency particulate air (HEPA) filter.  The dust mite allergic individual should not be in a room which is being cleaned and should wait 1 hour after cleaning before going into the room. Do not sleep on upholstered furniture (eg, couches).   If possible removing carpeting, upholstered furniture and  drapery from the home is ideal.  Horizontal blinds should be eliminated in the rooms where the person spends the most time (bedroom, study, television room).  Washable vinyl, roller-type shades are optimal. Remove all non-washable stuffed toys from the bedroom.  Wash stuffed toys weekly like sheets and blankets above.   Reduce indoor humidity to less than 50%.  Inexpensive humidity monitors can be purchased at most hardware stores.  Do not use a humidifier as can make the problem worse and are not recommended.  Allergy Shots  Allergies are the result of a chain reaction that starts in the immune system. Your immune system controls how your body defends itself. For instance, if you have an allergy to pollen, your immune system identifies pollen as an invader or allergen. Your immune system overreacts by producing antibodies called Immunoglobulin E (IgE). These antibodies travel to cells that release chemicals, causing an allergic reaction.  The concept behind allergy immunotherapy, whether it is received in the form of shots or tablets, is that the immune system can be desensitized to specific allergens that trigger allergy symptoms. Although it requires time and patience, the payback can be long-term relief. Allergy injections contain a dilute solution of those substances that you are allergic to based upon your skin testing and allergy history.   How Do Allergy Shots Work?  Allergy shots work much like a vaccine. Your body responds to injected amounts of a particular allergen given in increasing doses, eventually developing a resistance and tolerance to it. Allergy shots can lead to decreased, minimal or no allergy symptoms.  There generally are two phases: build-up and maintenance. Build-up often ranges from three to six months and involves receiving injections with increasing amounts of the allergens. The shots are typically given once or twice a week, though more rapid build-up schedules are  sometimes used.  The maintenance phase begins when the most effective dose is reached. This dose is different for each person, depending on how allergic you are and your response to the build-up injections. Once the maintenance dose is reached, there are longer periods between injections, typically two to four weeks.  Occasionally doctors give cortisone-type shots that can temporarily reduce allergy symptoms. These types of shots are different and should not be confused with allergy immunotherapy shots.  Who Can Be Treated with Allergy Shots?  Allergy shots may be a good treatment approach for people with allergic rhinitis (hay fever), allergic asthma, conjunctivitis (eye allergy) or stinging insect allergy.   Before deciding to begin allergy shots, you should consider:   The length of allergy season and the severity of your symptoms  Whether medications and/or changes to your environment can control your symptoms  Your desire to avoid long-term medication use  Time: allergy immunotherapy requires  a major time commitment  Cost: may vary depending on your insurance coverage  Allergy shots for children age 78 and older are effective and often well tolerated. They might prevent the onset of new allergen sensitivities or the progression to asthma.  Allergy shots are not started on patients who are pregnant but can be continued on patients who become pregnant while receiving them. In some patients with other medical conditions or who take certain common medications, allergy shots may be of risk. It is important to mention other medications you talk to your allergist.   What are the two types of build-ups offered:   RUSH or Rapid Desensitization -- one day of injections lasting from 8:30-4:30pm, injections every 1 hour.  Approximately half of the build-up process is completed in that one day.  The following week, normal build-up is resumed, and this entails ~16 visits either weekly or twice  weekly, until reaching your "maintenance dose" which is continued weekly until eventually getting spaced out to every month for a duration of 3 to 5 years. The regular build-up appointments are nurse visits where the injections are administered, followed by required monitoring for 30 minutes.    Traditional build-up -- weekly visits for 6 -12 months until reaching "maintenance dose", then continue weekly until eventually spacing out to every 4 weeks as above. At these appointments, the injections are administered, followed by required monitoring for 30 minutes.     Either way is acceptable, and both are equally effective. With the rush protocol, the advantage is that less time is spent here for injections overall AND you would also reach maintenance dosing faster (which is when the clinical benefit starts to become more apparent). Not everyone is a candidate for rapid desensitization.   IF we proceed with the RUSH protocol, there are premedications which must be taken the day before and the day after the rush only (this includes antihistamines, steroids, and Singulair).  After the rush day, no prednisone or Singulair is required, and we just recommend antihistamines taken on your injection day.  What Is An Estimate of the Costs?  If you are interested in starting allergy injections, please check with your insurance company about your coverage for both allergy vial sets and allergy injections.  Please do so prior to making the appointment to start injections.  The following are CPT codes to give to your insurance company. These are the amounts we BILL to the insurance company, but the amount YOU WILL PAY and WE RECEIVE IS SUBSTANTIALLY LESS and depends on the contracts we have with different insurance companies.   Amount Billed to Insurance One allergy vial set  CPT 95165   $ 1200     Two allergy vial set  CPT 95165   $ 2400     Three allergy vial set  CPT 95165   $ 3600     One injection   CPT  95115   $ 35  Two injections   CPT 95117   $ 40 RUSH (Rapid Desensitization) CPT 95180 x 8 hours $500/hour  Regarding the allergy injections, your co-pay may or may not apply with each injection, so please confirm this with your insurance company. When you start allergy injections, 1 or 2 sets of vials are made based on your allergies.  Not all patients can be on one set of vials. A set of vials lasts 6 months to a year depending on how quickly you can proceed with your build-up of your allergy injections.  Vials are personalized for each patient depending on their specific allergens.  How often are allergy injection given during the build-up period?   Injections are given at least weekly during the build-up period until your maintenance dose is achieved. Per the doctor's discretion, you may have the option of getting allergy injections two times per week during the build-up period. However, there must be at least 48 hours between injections. The build-up period is usually completed within 6-12 months depending on your ability to schedule injections and for adjustments for reactions. When maintenance dose is reached, your injection schedule is gradually changed to every two weeks and later to every three weeks. Injections will then continue every 4 weeks. Usually, injections are continued for a total of 3-5 years.   When Will I Feel Better?  Some may experience decreased allergy symptoms during the build-up phase. For others, it may take as long as 12 months on the maintenance dose. If there is no improvement after a year of maintenance, your allergist will discuss other treatment options with you.  If you aren't responding to allergy shots, it may be because there is not enough dose of the allergen in your vaccine or there are missing allergens that were not identified during your allergy testing. Other reasons could be that there are high levels of the allergen in your environment or major exposure to  non-allergic triggers like tobacco smoke.  What Is the Length of Treatment?  Once the maintenance dose is reached, allergy shots are generally continued for three to five years. The decision to stop should be discussed with your allergist at that time. Some people may experience a permanent reduction of allergy symptoms. Others may relapse and a longer course of allergy shots can be considered.  What Are the Possible Reactions?  The two types of adverse reactions that can occur with allergy shots are local and systemic. Common local reactions include very mild redness and swelling at the injection site, which can happen immediately or several hours after. Report a delayed reaction from your last injection. These include arm swelling or runny nose, watery eyes or cough that occurs within 12-24 hours after injection. A systemic reaction, which is less common, affects the entire body or a particular body system. They are usually mild and typically respond quickly to medications. Signs include increased allergy symptoms such as sneezing, a stuffy nose or hives.   Rarely, a serious systemic reaction called anaphylaxis can develop. Symptoms include swelling in the throat, wheezing, a feeling of tightness in the chest, nausea or dizziness. Most serious systemic reactions develop within 30 minutes of allergy shots. This is why it is strongly recommended you wait in your doctor's office for 30 minutes after your injections. Your allergist is trained to watch for reactions, and his or her staff is trained and equipped with the proper medications to identify and treat them.   Report to the nurse immediately if you experience any of the following symptoms: swelling, itching or redness of the skin, hives, watery eyes/nose, breathing difficulty, excessive sneezing, coughing, stomach pain, diarrhea, or light headedness. These symptoms may occur within 15-20 minutes after injection and may require medication.   Who  Should Administer Allergy Shots?  The preferred location for receiving shots is your prescribing allergist's office. Injections can sometimes be given at another facility where the physician and staff are trained to recognize and treat reactions, and have received instructions by your prescribing allergist.  What if I am late for an  injection?   Injection dose will be adjusted depending upon how many days or weeks you are late for your injection.   What if I am sick?   Please report any illness to the nurse before receiving injections. She may adjust your dose or postpone injections depending on your symptoms. If you have fever, flu, sinus infection or chest congestion it is best to postpone allergy injections until you are better. Never get an allergy injection if your asthma is causing you problems. If your symptoms persist, seek out medical care to get your health problem under control.  What If I am or Become Pregnant:  Women that become pregnant should schedule an appointment with [facility] before receiving any further allergy injections.

## 2023-12-13 ENCOUNTER — Encounter: Payer: Self-pay | Admitting: Allergy & Immunology

## 2023-12-15 LAB — IGE NUT PROF. W/COMPONENT RFLX

## 2023-12-17 LAB — IGE NUT PROF. W/COMPONENT RFLX
F017-IgE Hazelnut (Filbert): 7.89 kU/L — AB
F018-IgE Brazil Nut: <0.1 kU/L
F202-IgE Cashew Nut: 0.5 kU/L — AB
F202-IgE Cashew Nut: 1.95 kU/L — AB
F256-IgE Walnut: 0.21 kU/L — AB
Jug R 3 IgE: 0.72 kU/L — AB
Macadamia Nut, IgE: 0.27 kU/L — AB
Peanut, IgE: 1 kU/L — AB
Pecan Nut IgE: <0.1 kU/L

## 2023-12-17 LAB — PEANUT COMPONENTS
F352-IgE Ara h 8: 11.3 kU/L — AB
F422-IgE Ara h 1: <0.1 kU/L
F423-IgE Ara h 2: <0.1 kU/L
F424-IgE Ara h 3: <0.1 kU/L
F427-IgE Ara h 9: <0.1 kU/L
F447-IgE Ara h 6: <0.1 kU/L

## 2023-12-17 LAB — ALLERGEN SESAME F10: Sesame Seed IgE: 1.73 kU/L — AB

## 2023-12-17 LAB — MILK COMPONENT PANEL
F076-IgE Alpha Lactalbumin: 0.12 kU/L — AB
F077-IgE Beta Lactoglobulin: 0.11 kU/L — AB
F078-IgE Casein: 0.15 kU/L — AB

## 2023-12-17 LAB — PANEL 604721
Jug R 1 IgE: 0.1 kU/L — AB
Jug R 3 IgE: 0.13 kU/L — AB

## 2023-12-17 LAB — PANEL 604726
Cor A 1 IgE: 17.8 kU/L — AB
Cor A 14 IgE: <0.1 kU/L
Cor A 8 IgE: <0.1 kU/L
Cor A 9 IgE: <0.1 kU/L

## 2023-12-17 LAB — PANEL 604239: ANA O 3 IgE: 0.2 kU/L — AB

## 2023-12-17 LAB — ALLERGEN COMPONENT COMMENTS

## 2023-12-26 ENCOUNTER — Ambulatory Visit: Payer: Self-pay | Admitting: Allergy & Immunology

## 2023-12-28 NOTE — Progress Notes (Unsigned)
 12/29/2023 Jordan Ford [id number] 07-03-1985  Referring provider: Elnor Lauraine BRAVO, NP Primary GI doctor: Dr. Suzann  ASSESSMENT AND PLAN:  GERD, dysphagia, epigastric pain x May 09/04/2023 H. pylori breath test negative On omeprazole  20 mg BID since August that helped the AB pain but continues to have dysphagia/glubus sensation with regurgitation Rare NSAIDS, ETOH, tobacco use, drug Denies melena, weight loss History of eczema, allergies, suspicious for EOE/LPR, rule out celiac, gallbladder - check celiac, Cbc, CMET -alginate therapy given -Lifestyle changes discussed, avoid NSAIDS, ETOH, hand out given to the patient - continue omeprazole  20 mg BID -Schedule EGD with possible dilitationat LEC to evaluate GERD, esophagitis, EOE, hiatal hernia. I discussed risks of EGD with patient today, including risk of sedation, bleeding or perforation. Patient provides understanding and gave verbal consent to proceed. - RUQ US  with pain on palpation today on exam, negative murphy and HIDA pending results -Consider SIBO testing or empiric treatment if everything is negative  Allergies, eczema but no history of asthma [facility]    Alternating diarrhea/constipation No family history of IBD, colon cancer No hematochezia - Increase fiber/ water intake, decrease caffeine, increase activity level. - check celiac panel -Will add on Benefiber - screening colonoscopy age 73 unless symptoms prior   I have reviewed the clinic note as outlined by Alan Coombs, PA and agree with the assessment, plan and medical decision making.  [name] presents to the office today for evaluation of GERD, dysphagia, abdominal discomfort, bloating, constipation and diarrhea.  Reports improvement in GERD symptoms and abdominal discomfort on twice daily PPI but continuing to endorse dysphagia.  Agree with proceeding with EGD.  Lower GI symptoms seem very consistent with  IBS.  Reasonable to test for celiac and augment fiber in diet.  Inocente Suzann, MD   Patient Care Team: Elnor Lauraine BRAVO, NP as PCP - General (Nurse Practitioner)  HISTORY OF PRESENT ILLNESS: 38 y.o. female with a past medical history listed below presents for evaluation of dysphagia/GERd/regurg.   Discussed the use of AI scribe software for clinical note transcription with the patient, who gave verbal consent to proceed.  History of Present Illness   Jordan Ford is a 38 year old female who presents with persistent reflux symptoms despite treatment.  She has been experiencing symptoms of reflux since the end of May, characterized by a sensation of food sitting in her throat and difficulty swallowing, often requiring multiple swallows to clear food.  By July, she developed severe stomach pain that eased with eating but returned when hungry, causing significant distress.  In August, she was prescribed omeprazole  20 mg twice daily, which alleviated the stomach pain, but she continues to experience the sensation of food being stuck and occasional regurgitation.  She denies regular use of NSAIDs, only taking ibuprofen  occasionally for headaches. She does not consume alcohol, smoke, or use drugs. No dark stools or weight loss.  She has a history of allergies, contact dermatitis on her fingers, sinus problems, and sinus migraines. She sometimes feels nauseous, attributing it to sinus drainage.  She experiences frequent bloating and alternating diarrhea and constipation, which she considers normal.  Her family history includes her mother having mixed connective tissue disease. She denies any gallbladder issues, and notes that certain foods like fries can trigger reflux.        She  reports that she has never smoked. She has never used smokeless tobacco. She reports that she does not drink  alcohol and does not use drugs.  RELEVANT GI HISTORY, IMAGING AND LABS: Results          CBC     Component Value Date/Time   WBC 6.9 09/04/2023 1451   RBC 4.48 09/04/2023 1451   HGB 12.8 09/04/2023 1451   HCT 37.9 09/04/2023 1451   PLT 255.0 09/04/2023 1451   MCV 84.6 09/04/2023 1451   MCH 30.7 10/27/2018 0459   MCHC 33.9 09/04/2023 1451   RDW 13.4 09/04/2023 1451   LYMPHSABS 2.2 09/04/2023 1451   MONOABS 0.4 09/04/2023 1451   EOSABS 0.4 09/04/2023 1451   BASOSABS 0.0 09/04/2023 1451   Recent Labs    08/02/23 1542 09/04/23 1451  HGB 13.0 12.8    CMP     Component Value Date/Time   NA 138 09/04/2023 1451   K 3.7 09/04/2023 1451   CL 102 09/04/2023 1451   CO2 28 09/04/2023 1451   GLUCOSE 85 09/04/2023 1451   BUN 8 09/04/2023 1451   CREATININE 0.86 09/04/2023 1451   CALCIUM  9.5 09/04/2023 1451   PROT 7.3 09/04/2023 1451   ALBUMIN 4.4 09/04/2023 1451   AST 30 09/04/2023 1451   ALT 29 09/04/2023 1451   ALKPHOS 56 09/04/2023 1451   BILITOT 0.5 09/04/2023 1451   GFRNONAA >60 08/03/2018 1755   GFRAA >60 08/03/2018 1755      Latest Ref Rng & Units 09/04/2023    2:51 PM 08/02/2023    3:42 PM 09/29/2022   11:58 AM  Hepatic Function  Total Protein 6.0 - 8.3 g/dL 7.3  7.7  7.2   Albumin 3.5 - 5.2 g/dL 4.4  4.4  4.2   AST 0 - 37 U/L 30  25  24    ALT 0 - 35 U/L 29  20  19    Alk Phosphatase 39 - 117 U/L 56  58  48   Total Bilirubin 0.2 - 1.2 mg/dL 0.5  0.5  0.3       Current Medications:     Current Outpatient Medications (Respiratory):    fluticasone (FLONASE) 50 MCG/ACT nasal spray, Place 1 spray into both nostrils daily.    Current Outpatient Medications (Other):    omeprazole  (PRILOSEC) 20 MG capsule, Take 1 capsule (20 mg total) by mouth 2 (two) times daily before a meal.   Horse Chestnut 300 MG CAPS, Take 250 capsules by mouth daily. (Patient not taking: Reported on 12/29/2023)   psyllium (METAMUCIL) 58.6 % powder, Take 1 packet by mouth 3 (three) times daily. (Patient not taking: Reported on 12/29/2023)  Medical History:  Past Medical History:   Diagnosis Date   Condyloma    Hx of varicella    Late prenatal care    Medical history non-contributory    UTI (urinary tract infection)    Allergies:  Allergies  Allergen Reactions   Latex Itching   Amoxil [Amoxicillin] Hives   Penicillins Hives     Surgical History:  She  has a past surgical history that includes Myringotomy and Tympanostomy tube placement. Family History:  Her family history includes Cancer in her paternal grandmother; Diabetes in her maternal grandmother; Heart disease in her maternal grandfather; Hypertension in her father; Mental retardation in her maternal aunt.  REVIEW OF SYSTEMS  : All other systems reviewed and negative except where noted in the History of Present Illness.  PHYSICAL EXAM: BP 104/68   Pulse 82   Ht 5' 2.21 (1.58 m)   Wt 135 lb (61.2 kg)   BMI  24.53 kg/m  Physical Exam   GENERAL APPEARANCE: Well nourished, in no apparent distress. HEENT: No cervical lymphadenopathy, unremarkable thyroid , sclerae anicteric, conjunctiva pink. RESPIRATORY: Respiratory effort normal, breath sounds equal bilaterally without rales, rhonchi, or wheezing. CARDIO: Regular rate and rhythm with no murmurs, rubs, or gallops, peripheral pulses intact. ABDOMEN: Soft, non-distended, active bowel sounds in all four quadrants, mild tenderness to palpation, no rebound, no mass appreciated. RECTAL: Declines. MUSCULOSKELETAL: Full range of motion, normal gait, without edema. SKIN: Dry, intact without rashes or lesions. No jaundice. NEURO: Alert, oriented, no focal deficits. PSYCH: Cooperative, normal mood and affect.      Alan JONELLE Coombs, PA-C 10:15 AM

## 2023-12-29 ENCOUNTER — Ambulatory Visit: Payer: Self-pay | Admitting: Physician Assistant

## 2023-12-29 ENCOUNTER — Ambulatory Visit: Admitting: Physician Assistant

## 2023-12-29 ENCOUNTER — Other Ambulatory Visit

## 2023-12-29 ENCOUNTER — Encounter: Payer: Self-pay | Admitting: Physician Assistant

## 2023-12-29 VITALS — BP 104/68 | HR 82 | Ht 62.21 in | Wt 135.0 lb

## 2023-12-29 DIAGNOSIS — K219 Gastro-esophageal reflux disease without esophagitis: Secondary | ICD-10-CM

## 2023-12-29 DIAGNOSIS — R140 Abdominal distension (gaseous): Secondary | ICD-10-CM

## 2023-12-29 DIAGNOSIS — R1310 Dysphagia, unspecified: Secondary | ICD-10-CM

## 2023-12-29 DIAGNOSIS — R1013 Epigastric pain: Secondary | ICD-10-CM

## 2023-12-29 DIAGNOSIS — J309 Allergic rhinitis, unspecified: Secondary | ICD-10-CM

## 2023-12-29 LAB — CBC WITH DIFFERENTIAL/PLATELET
Basophils Absolute: 0 K/uL (ref 0.0–0.1)
Basophils Relative: 0.6 % (ref 0.0–3.0)
Eosinophils Absolute: 0.1 K/uL (ref 0.0–0.7)
Eosinophils Relative: 1.2 % (ref 0.0–5.0)
HCT: 39.9 % (ref 36.0–46.0)
Hemoglobin: 13.6 g/dL (ref 12.0–15.0)
Lymphocytes Relative: 32 % (ref 12.0–46.0)
Lymphs Abs: 2.2 K/uL (ref 0.7–4.0)
MCHC: 34.1 g/dL (ref 30.0–36.0)
MCV: 84.4 fl (ref 78.0–100.0)
Monocytes Absolute: 0.4 K/uL (ref 0.1–1.0)
Monocytes Relative: 5.1 % (ref 3.0–12.0)
Neutro Abs: 4.2 K/uL (ref 1.4–7.7)
Neutrophils Relative %: 61.1 % (ref 43.0–77.0)
Platelets: 247 K/uL (ref 150.0–400.0)
RBC: 4.73 Mil/uL (ref 3.87–5.11)
RDW: 13.3 % (ref 11.5–15.5)
WBC: 6.8 K/uL (ref 4.0–10.5)

## 2023-12-29 LAB — COMPREHENSIVE METABOLIC PANEL WITH GFR
ALT: 15 U/L (ref 0–35)
AST: 17 U/L (ref 0–37)
Albumin: 4.6 g/dL (ref 3.5–5.2)
Alkaline Phosphatase: 53 U/L (ref 39–117)
BUN: 14 mg/dL (ref 6–23)
CO2: 26 meq/L (ref 19–32)
Calcium: 9.4 mg/dL (ref 8.4–10.5)
Chloride: 103 meq/L (ref 96–112)
Creatinine, Ser: 0.91 mg/dL (ref 0.40–1.20)
GFR: 81.31 mL/min (ref >60.00)
Glucose, Bld: 79 mg/dL (ref 70–99)
Potassium: 3.5 meq/L (ref 3.5–5.1)
Sodium: 138 meq/L (ref 135–145)
Total Bilirubin: 0.6 mg/dL (ref 0.2–1.2)
Total Protein: 7.4 g/dL (ref 6.0–8.3)

## 2023-12-29 NOTE — Patient Instructions (Signed)
 Your provider has requested that you go to the basement level for lab work before leaving today. Press B on the elevator. The lab is located at the first door on the left as you exit the elevator.  You have been scheduled for an abdominal ultrasound at [facility] (1st floor of hospital) on 01/08/2024 at 7:45 check in . Please arrive 30 minutes prior to your appointment for registration. Make certain not to have anything to eat or drink 6 hours prior to your appointment. Should you need to reschedule your appointment, please contact radiology at [phone]. This test typically takes about 30 minutes to perform.   Please take your proton pump inhibitor medication, omeprazole  20 mg twice a day  Please take this medication 30 minutes to 1 hour before meals- this makes it more effective.   Silent reflux: Not all heartburn burns...SABRASABRASABRA  What is LPR? Laryngopharyngeal reflux (LPR) or silent reflux is a condition in which acid that is made in the stomach travels up the esophagus (swallowing tube) and gets to the throat. Not everyone with reflux has a lot of heartburn or indigestion. In fact, many people with LPR never have heartburn. This is why LPR is called SILENT REFLUX, and the terms Silent reflux and LPR are often used interchangeably. Because LPR is silent, it is sometimes difficult to diagnose.  How can you tell if you have LPR?  Chronic hoarseness- Some people have hoarseness that comes and goes throat clearing  Cough It can cause shortness of breath and cause asthma like symptoms. a feeling of a lump in the throat  difficulty swallowing a problem with too much nose and throat drainage.  Some people will feel their esophagus spasm which feels like their heart beating hard and fast, this will usually be after a meal, at rest, or lying down at night.    How do I treat this? Treatment for LPR should be individualized, and your doctor will suggest the best treatment for you.  Generally there are several treatments for LPR: changing habits and diet to reduce reflux,  medications to reduce stomach acid, and  surgery to prevent reflux. Most people with LPR need to modify how and when they eat, as well as take some medication, to get well. Sometimes, nonprescription liquid antacids, such as Maalox, Gelucil and Mylanta are recommended. When used, these antacids should be taken four times each day - one tablespoon one hour after each meal and before bedtime. Dietary and lifestyle changes alone are not often enough to control LPR - medications that reduce stomach acid are also usually needed. These must be prescribed by our doctor.   TIPS FOR REDUCING REFLUX AND LPR Control your LIFE-STYLE and your DIET! If you use tobacco, QUIT.  Smoking makes you reflux. After every cigarette you have some LPR.  Don't wear clothing that is too tight, especially around the waist (trousers, corsets, belts).  Do not lie down just after eating...in fact, do not eat within three hours of bedtime.  You should be on a low-fat diet.  Limit your intake of red meat.  Limit your intake of butter.  Avoid fried foods.  Avoid chocolate  Avoid cheese.  Avoid eggs. Specifically avoid caffeine (especially coffee and tea), soda pop (especially cola) and mints.  Avoid alcoholic beverages, particularly in the evening.   Reflux Gourmet Rescue  It is an ALGINATE THERAPY which is the only intervention that works to safeguard the esophagus by creating a protective barrier that actually stops  reflux from happening. -The general directions for use are as stated on the packaging: Take 1 teaspoon (5 ml), or more as needed or as directed by your physician, after meals and before bed. -These general directions address the most common times for reflux to occur, but our Rescue products may be taken anytime. Some individuals may take our product preemptively, when they know they will suffer from reflux, or as  needed - when discomfort arises. (If taken around food, it should be consumed last.) -You do not have to take 1 teaspoon (5 ml) of the product. While one teaspoon (5ml) may be the perfect average amount to relieve reflux suffering in some, others may require more or less. You may adjust the amount of Mint Chocolate Rescue and Vanilla Caramel Rescue to the lowest amount necessary to meet your individual needs to improve your quality of life. -You may dilute the product if it is too viscous for you to consume. Keep in mind, however, that the thickness of the product was formulated to provide optimal coating and protection of your throat and esophagus. Though diluting the product is possible, it may reduce the protective function and/or length of action. -This can be used in conjunction with reflux medications and lifestyle changes.  100% ALL-NATURAL  Paraben FREE, glycerin  FREE, & potassium FREE  Made entirely from all-natural ingredients considered safe for children and during pregnancy  No known side effects  All-natural flavor Gluten FREE  Allergen FREE  Vegan  Can find more information here: nameseizer.co.nz  What is EOE? Eosinophilic esophagitis is a chronic (long-lasting) condition where white blood cells called eosinophils build up in the lining of your esophagus (the tube that carries food from your mouth to your stomach). This build-up causes inflammation, which can make swallowing difficult or uncomfortable. What Causes EoE? The exact cause is not fully known, but it's believed to be an allergic or immune reaction, often triggered by: Certain foods (common: milk, eggs, wheat, soy, nuts, seafood) Environmental allergens (like pollen, dust mites, mold) Genetics (runs in some families)  Common Symptoms Trouble swallowing (especially dry or solid foods) Food getting stuck in your throat Chest pain or heartburn Abdominal pain Nausea or  vomiting Poor appetite (especially in children) Slow growth or weight loss (in children)  How Is It Diagnosed? Diagnosis usually includes: Upper endoscopy - A thin tube with a camera is inserted through your mouth to look at your esophagus. Biopsy - Small tissue samples are taken and examined under a microscope. Allergy testing - To identify possible food or environmental triggers.   Treatment Options Treatment focuses on reducing inflammation and preventing food from getting stuck:  1. Dietary Therapy Elimination Diet: Remove common trigger foods (usually 6-8 major allergens). Elemental Diet: Uses a special amino acid-based formula. Targeted Elimination: Based on allergy testing or food history.  2. Medications Swallowed corticosteroids: Like fluticasone or budesonide to reduce inflammation (not the same as asthma inhalers; the medicine is swallowed, not inhaled). Acid reducers: Like proton pump inhibitors (PPIs) to help with reflux and inflammation.  3. Esophageal Dilation If your esophagus becomes narrowed (a stricture), a doctor may stretch it to help with swallowing.  Long-Term Management EoE is a chronic condition, but symptoms can be managed with: Ongoing treatment Follow-up endoscopies Avoiding trigger foods Working with a gastroenterologist, allergist, and dietitian  Tips for Living with EoE Chew food slowly and thoroughly Keep a food/symptom diary Read food labels carefully Follow your doctor's dietary plan Discuss any swallowing difficulties promptly  Due to recent changes in healthcare laws, you may see the results of your imaging and laboratory studies on MyChart before your provider has had a chance to review them.  We understand that in some cases there may be results that are confusing or concerning to you. Not all laboratory results come back in the same time frame and the provider may be waiting for multiple results in order to interpret others.  Please give  us  48 hours in order for your provider to thoroughly review all the results before contacting the office for clarification of your results.

## 2023-12-30 LAB — IGA: Immunoglobulin A: 303 mg/dL (ref 47–310)

## 2023-12-30 LAB — TISSUE TRANSGLUTAMINASE, IGA: (tTG) Ab, IgA: <1 U/mL

## 2024-01-03 NOTE — Progress Notes (Unsigned)
 [facility] History and Physical   Primary Care Physician:  Elnor Lauraine BRAVO, NP   Reason for Procedure:  GERD, dysphagia, epigastric abdominal pain, constipation, diarrhea  Plan:    Upper endoscopy with possible dilation     HPI: Jordan Ford is a 38 y.o. female undergoing upper endoscopy with possible dilation for evaluation of GERD, dysphagia, abdominal discomfort, bloating, constipation and diarrhea. Reports improvement in GERD symptoms and abdominal discomfort on twice daily PPI but continuing to endorse dysphagia.  Lower GI symptoms of constipation and diarrhea seem consistent with IBS.  Serologic testing for celiac disease negative.  Does have a history of allergies and eczema but no asthma-rule out eosinophilic disorders.   Past Medical History:  Diagnosis Date   Condyloma    Hx of varicella    Late prenatal care    Medical history non-contributory    UTI (urinary tract infection)     Past Surgical History:  Procedure Laterality Date   MYRINGOTOMY     TYMPANOSTOMY TUBE PLACEMENT      Prior to Admission medications   Medication Sig Start Date End Date Taking? Authorizing Provider  fluticasone (FLONASE) 50 MCG/ACT nasal spray Place 1 spray into both nostrils daily. 06/14/22   [provider]  Horse Chestnut 300 MG CAPS Take 250 capsules by mouth daily. Patient not taking: Reported on 12/29/2023    [provider]  omeprazole  (PRILOSEC) 20 MG capsule Take 1 capsule (20 mg total) by mouth 2 (two) times daily before a meal. 09/04/23   Alvia Corean CROME, FNP  psyllium (METAMUCIL) 58.6 % powder Take 1 packet by mouth 3 (three) times daily. Patient not taking: Reported on 12/29/2023    [provider]    Current Outpatient Medications  Medication Sig Dispense Refill   omeprazole  (PRILOSEC) 20 MG capsule Take 1 capsule (20 mg total) by mouth 2 (two) times daily before a meal. 60 capsule 3   fluticasone (FLONASE) 50 MCG/ACT nasal  spray Place 1 spray into both nostrils daily.     Horse Chestnut 300 MG CAPS Take 250 capsules by mouth daily. (Patient not taking: No sig reported)     psyllium (METAMUCIL) 58.6 % powder Take 1 packet by mouth 3 (three) times daily. (Patient not taking: No sig reported)     Current Facility-Administered Medications  Medication Dose Route Frequency Provider Last Rate Last Admin   0.9 %  sodium chloride  infusion  500 mL Intravenous Once Diamond Jentz M, MD        Allergies as of 01/05/2024 - Review Complete 01/05/2024  Allergen Reaction Noted   Amoxil [amoxicillin] Hives 08/29/2011   Latex Itching 09/10/2020   Penicillins Hives 08/29/2011    Family History  Problem Relation Age of Onset   Hypertension Father    Mental retardation Maternal Aunt        downs   Diabetes Maternal Grandmother    Heart disease Maternal Grandfather    Cancer Paternal Grandmother        breast    Social History   Socioeconomic History   Marital status: Married    Spouse name: Not on file   Number of children: 1   Years of education: Not on file   Highest education level: Not on file  Occupational History   Occupation: self employed  Tobacco Use   Smoking status: Never   Smokeless tobacco: Never  Vaping Use   Vaping status: Never Used  Substance and Sexual Activity   Alcohol use:  No   Drug use: No   Sexual activity: Not Currently  Other Topics Concern   Not on file  Social History Narrative   Not on file   Social Drivers of Health   Financial Resource Strain: Not on file  Food Insecurity: Not on file  Transportation Needs: Not on file  Physical Activity: Not on file  Stress: Not on file  Social Connections: Not on file  Intimate Partner Violence: Not on file    Review of Systems:  All other review of systems negative except as mentioned in the HPI.  Physical Exam: Vital signs BP 109/74   Pulse 74   Temp 98.2 F (36.8 C) (Temporal)   Resp 17   Ht 5' 2 (1.575 m)   Wt  135 lb (61.2 kg)   SpO2 100%   BMI 24.69 kg/m   General:   Alert,  Well-developed, well-nourished, pleasant and cooperative in NAD Airway:  Mallampati 2 Lungs:  Clear throughout to auscultation.   Heart:  Regular rate and rhythm; no murmurs, clicks, rubs,  or gallops. Abdomen:  Soft, nontender and nondistended. Normal bowel sounds.   Neuro/Psych:  Normal mood and affect. A and O x 3  Inocente Hausen, MD [facility]

## 2024-01-05 ENCOUNTER — Ambulatory Visit (AMBULATORY_SURGERY_CENTER): Admitting: Pediatrics

## 2024-01-05 ENCOUNTER — Encounter: Payer: Self-pay | Admitting: Pediatrics

## 2024-01-05 VITALS — BP 110/66 | HR 73 | Temp 98.2°F | Resp 12 | Ht 62.0 in | Wt 135.0 lb

## 2024-01-05 DIAGNOSIS — R140 Abdominal distension (gaseous): Secondary | ICD-10-CM

## 2024-01-05 DIAGNOSIS — R1310 Dysphagia, unspecified: Secondary | ICD-10-CM

## 2024-01-05 DIAGNOSIS — K219 Gastro-esophageal reflux disease without esophagitis: Secondary | ICD-10-CM | POA: Diagnosis not present

## 2024-01-05 DIAGNOSIS — R1013 Epigastric pain: Secondary | ICD-10-CM

## 2024-01-05 MED ORDER — SODIUM CHLORIDE 0.9 % IV SOLN: 500.0000 mL | Freq: Once | INTRAVENOUS | Status: DC

## 2024-01-05 NOTE — Op Note (Signed)
 [facility] Patient Name: [facility] Procedure Date: 01/05/2024 8:39 AM MRN: [id number] Endoscopist: Inocente Hausen , MD, [id number] Age: 38 Referring MD:  Date of Birth: 03/13/1985 Gender: Female Account #: [id number] Procedure:                Upper GI endoscopy Indications:              Epigastric abdominal pain, Dysphagia, Follow-up of                            gastro-esophageal reflux disease, Diarrhea Medicines:                Monitored Anesthesia Care Procedure:                Pre-Anesthesia Assessment:                           - Prior to the procedure, a History and Physical                            was performed, and patient medications and                            allergies were reviewed. The patient's tolerance of                            previous anesthesia was also reviewed. The risks                            and benefits of the procedure and the sedation                            options and risks were discussed with the patient.                            All questions were answered, and informed consent                            was obtained. Prior Anticoagulants: The patient has                            taken no anticoagulant or antiplatelet agents. ASA                            Grade Assessment: II - A patient with mild systemic                            disease. After reviewing the risks and benefits,                            the patient was deemed in satisfactory condition to                            undergo the procedure.  After obtaining informed consent, the endoscope was                            passed under direct vision. Throughout the                            procedure, the patient's blood pressure, pulse, and                            oxygen saturations were monitored continuously. The                            GIF [id number] #[id number] was introduced through the                            mouth, and  advanced to the second part of duodenum.                            The upper GI endoscopy was accomplished without                            difficulty. The patient tolerated the procedure                            well. Scope In: Scope Out: Findings:                 No endoscopic abnormality was evident in the                            esophagus to explain the patient's complaint of                            dysphagia. It was decided, however, to proceed with                            dilation of the entire esophagus. A guidewire was                            placed and the scope was withdrawn. Dilation was                            performed with a Savary dilator with no resistance                            at 18 mm. Biopsies were obtained from the proximal                            and distal esophagus with cold forceps for                            histology for evaluation of eosinophilic  esophagitis.                           The gastric body, gastric antrum, cardia (on                            retroflexion) and gastric fundus (on retroflexion)                            were normal. Biopsies were taken with a cold                            forceps for Helicobacter pylori testing.                           The duodenal bulb and second portion of the                            duodenum were normal. Biopsies for histology were                            taken with a cold forceps for evaluation of celiac                            disease. Complications:            No immediate complications. Estimated blood loss:                            Minimal. Estimated Blood Loss:     Estimated blood loss was minimal. Impression:               - No endoscopic esophageal abnormality to explain                            patient's dysphagia. Esophagus dilated. Dilated.                           - Normal gastric body, antrum, cardia and gastric                             fundus. Biopsied.                           - Normal duodenal bulb and second portion of the                            duodenum. Biopsied.                           - Biopsies were taken with a cold forceps for                            evaluation of eosinophilic esophagitis. Recommendation:           - Discharge patient to home (ambulatory).                           -  Await pathology results.                           - Continue present medications.                           - The findings and recommendations were discussed                            with the patient's family.                           - Return to GI clinic in 2 months with Dr. Suzann                            or APP Oscar Coombs or [facility] May).                           - Patient has a contact number available for                            emergencies. The signs and symptoms of potential                            delayed complications were discussed with the                            patient. Return to normal activities tomorrow.                            Written discharge instructions were provided to the                            patient. Inocente Suzann, MD 01/05/2024 9:12:03 AM This report has been signed electronically.

## 2024-01-05 NOTE — Progress Notes (Signed)
 I have reviewed the patient's medical history in detail and updated the computerized patient record.

## 2024-01-05 NOTE — Patient Instructions (Addendum)
 SEE POST DILATION DIET HANDOUT Continue present medications Await pathology results RETURN to GI clinic in 2 months( you will receive a letter in the mail to schedule this appointment)  YOU HAD AN ENDOSCOPIC PROCEDURE TODAY AT THE [facility]:   Refer to the procedure report that was given to you for any specific questions about what was found during the examination.  If the procedure report does not answer your questions, please call your gastroenterologist to clarify.  If you requested that your care partner not be given the details of your procedure findings, then the procedure report has been included in a sealed envelope for you to review at your convenience later.  YOU SHOULD EXPECT: Some feelings of bloating in the abdomen. Passage of more gas than usual.  Walking can help get rid of the air that was put into your GI tract during the procedure and reduce the bloating. If you had a lower endoscopy (such as a colonoscopy or flexible sigmoidoscopy) you may notice spotting of blood in your stool or on the toilet paper. If you underwent a bowel prep for your procedure, you may not have a normal bowel movement for a few days.  Please Note:  You might notice some irritation and congestion in your nose or some drainage.  This is from the oxygen used during your procedure.  There is no need for concern and it should clear up in a day or so.  SYMPTOMS TO REPORT IMMEDIATELY:  Following upper endoscopy (EGD)  Vomiting of blood or coffee ground material  New chest pain or pain under the shoulder blades  Painful or persistently difficult swallowing  New shortness of breath  Fever of 100F or higher  Black, tarry-looking stools For urgent or emergent issues, a gastroenterologist can be reached at any hour by calling [phone]. Do not use MyChart messaging for urgent concerns.   DIET:  We do recommend a small meal at first, but then you may proceed to your regular diet.  Drink plenty  of fluids but you should avoid alcoholic beverages for 24 hours.  ACTIVITY:  You should plan to take it easy for the rest of today and you should NOT DRIVE or use heavy machinery until tomorrow (because of the sedation medicines used during the test).    FOLLOW UP: Our staff will call the number listed on your records the next business day following your procedure.  We will call around 7:15- 8:00 am to check on you and address any questions or concerns that you may have regarding the information given to you following your procedure. If we do not reach you, we will leave a message.     If any biopsies were taken you will be contacted by phone or by letter within the next 1-3 weeks.  Please call us  at [phone] if you have not heard about the biopsies in 3 weeks.    SIGNATURES/CONFIDENTIALITY: You and/or your care partner have signed paperwork which will be entered into your electronic medical record.  These signatures attest to the fact that that the information above on your After Visit Summary has been reviewed and is understood.  Full responsibility of the confidentiality of this discharge information lies with you and/or your care-partner.

## 2024-01-05 NOTE — Progress Notes (Signed)
 Report to PACU, RN, vss, BBS= Clear.

## 2024-01-08 ENCOUNTER — Ambulatory Visit (HOSPITAL_COMMUNITY): Admission: RE | Admit: 2024-01-08 | Source: Ambulatory Visit

## 2024-01-08 ENCOUNTER — Telehealth: Payer: Self-pay

## 2024-01-08 NOTE — Telephone Encounter (Signed)
 No answer on follow-up call. Left VM for pt.

## 2024-01-09 ENCOUNTER — Ambulatory Visit: Payer: Self-pay | Admitting: Pediatrics

## 2024-01-09 LAB — SURGICAL PATHOLOGY

## 2024-01-19 ENCOUNTER — Ambulatory Visit (HOSPITAL_COMMUNITY)

## 2024-01-30 ENCOUNTER — Ambulatory Visit (HOSPITAL_COMMUNITY)
Admission: RE | Admit: 2024-01-30 | Discharge: 2024-01-30 | Disposition: A | Discharge status: Home / Self Care | Source: Ambulatory Visit | Attending: Physician Assistant | Admitting: Physician Assistant

## 2024-01-30 DIAGNOSIS — K219 Gastro-esophageal reflux disease without esophagitis: Secondary | ICD-10-CM | POA: Diagnosis present

## 2024-01-30 DIAGNOSIS — R1013 Epigastric pain: Secondary | ICD-10-CM | POA: Insufficient documentation

## 2024-01-30 DIAGNOSIS — R140 Abdominal distension (gaseous): Secondary | ICD-10-CM | POA: Insufficient documentation

## 2024-03-14 ENCOUNTER — Ambulatory Visit: Admitting: Allergy & Immunology

## 2024-04-04 ENCOUNTER — Ambulatory Visit: Admitting: Nurse Practitioner

## 2024-04-04 VITALS — BP 94/72 | HR 82 | Temp 98.2°F | Wt 133.2 lb

## 2024-04-04 DIAGNOSIS — R1012 Left upper quadrant pain: Secondary | ICD-10-CM | POA: Insufficient documentation

## 2024-04-04 MED ORDER — PANTOPRAZOLE SODIUM 40 MG PO TBEC: 40.0000 mg | DELAYED_RELEASE_TABLET | Freq: Every day | ORAL | 3 refills | Status: AC

## 2024-04-04 NOTE — Progress Notes (Signed)
 "  Established Patient Office Visit  Subjective   Patient ID: Jordan Ford, female    DOB: 10/29/1985  Age: 39 y.o. MRN: [id number]  Chief Complaint  Patient presents with   GI Problem    Patient states she began experiencing symptoms on Tuesday after eating. Symptoms initially started as heartburn. Patient now reports experiencing a sensation of tightness around the chest area when raising her arm or with certain body position changes, also radiates to the shoulder and neck. Furthermore is now having some pressure in the upper back of the head.    Discussed the use of AI scribe software for clinical note transcription with the patient, who gave verbal consent to proceed.  History of Present Illness Jordan Ford is a 39 year old female who presents with chest tightness and discomfort following a meal.  Chest pain and discomfort - Onset after eating a meal with hot sauce on Tuesday (2 days ago) - Initial sensation similar to heartburn, progressing to a bandlike tightness across the chest - Worsened by certain positions, raising arms, deep mouth breathing, and sneezing - No associated nausea, vomiting, breast pain, breast masses, arm numbness or tingling, or recent heavy lifting/muscular strain - Jogging or running does not worsen symptoms (pain is nonexertional)  Gastrointestinal symptoms and interventions - Partial but incomplete relief with omeprazole  and frequent doses of Pepto Bismol - Endoscopy with esophageal dilation performed in November. No evidence of H.pylori at that time - Diet limited to bland foods (bread, crackers, soup) to avoid worsening chest discomfort  Headache - Stopped caffeine on Wednesday after usually drinking two cups of coffee daily - Attributes headaches to caffeine withdrawal  Cardiovascular risk factors - Grandfather had a heart attack in his fifties - Does not smoke  Hearing changes - Reports muffled hearing in right ear, no  pain      Review of Systems  Cardiovascular:  Positive for chest pain.  Gastrointestinal:  Positive for heartburn. Negative for blood in stool, melena, nausea and vomiting.      Objective:     BP 94/72   Pulse 82   Temp 98.2 F (36.8 C) (Temporal)   Wt 133 lb 4 oz (60.4 kg)   SpO2 99%   BMI 24.37 kg/m  BP Readings from Last 3 Encounters:  04/04/24 94/72  01/05/24 110/66  12/29/23 104/68   Wt Readings from Last 3 Encounters:  04/04/24 133 lb 4 oz (60.4 kg)  01/05/24 135 lb (61.2 kg)  12/29/23 135 lb (61.2 kg)      Physical Exam Vitals reviewed.  Constitutional:      General: She is not in acute distress.    Appearance: Normal appearance.  HENT:     Head: Normocephalic and atraumatic.     Right Ear: There is impacted cerumen.     Left Ear: Hearing, ear canal and external ear normal.     Ears:     Comments: Scar tissue noted to TM (left side). Hx of ear tubes. Neck:     Vascular: No carotid bruit.  Cardiovascular:     Rate and Rhythm: Normal rate and regular rhythm.     Pulses: Normal pulses.     Heart sounds: Normal heart sounds.  Pulmonary:     Effort: Pulmonary effort is normal.     Breath sounds: Normal breath sounds.  Skin:    General: Skin is warm and dry.  Neurological:     General: No focal deficit present.  Mental Status: She is alert and oriented to person, place, and time.  Psychiatric:        Mood and Affect: Mood normal.        Behavior: Behavior normal.        Judgment: Judgment normal.   EKG: NSR   No results found for any visits on 04/04/24.    The ASCVD Risk score (Arnett DK, et al., 2019) failed to calculate for the following reasons:   The 2019 ASCVD risk score is only valid for ages 6 to 20    Assessment & Plan:   Problem List Items Addressed This Visit       Other   Left upper quadrant abdominal pain - Primary   Gastroesophageal reflux disease Symptoms suggestive of GERD, possibly exacerbated by spicy food.  Differential includes cardiac etiology, but EKG normal and low risk factors. Previous endoscopy showed no significant findings. Consideration of gallstones due to referred pain possibility. - Prescribed pantoprazole  40mg /day daily for 8 weeks. - Advised to avoid spicy foods, mint, caffeine, and chocolate. - Ordered abdominal ultrasound to evaluate for gallstones. - Ordered labs including complete blood cell count and pancreatic enzymes. - Instructed to go to ER if symptoms worsen, especially with exertion       Relevant Medications   pantoprazole  (PROTONIX ) 40 MG tablet   Other Relevant Orders   EKG 12-Lead   US  Abdomen Limited RUQ (LIVER/GB)   CBC   Comprehensive metabolic panel with GFR   Lipase   Assessment and Plan Assessment & Plan Gastroesophageal reflux disease Symptoms suggestive of GERD, possibly exacerbated by spicy food. Differential includes cardiac etiology, but EKG normal and low risk factors. Previous endoscopy showed no significant findings. Consideration of gallstones due to referred pain possibility. - Prescribed pantoprazole  40mg /day daily for 8 weeks. - Advised to avoid spicy foods, mint, caffeine, and chocolate. - Ordered abdominal ultrasound to evaluate for gallstones. - Ordered labs including complete blood cell count and pancreatic enzymes. - Instructed to go to ER if symptoms worsen, especially with exertion   Headache due to caffeine withdrawal Headache likely due to caffeine withdrawal after cessation of coffee intake. - Advised to hydrate adequately. - Recommended ibuprofen  or Tylenol  for headache relief.  Impacted cerumen, left ear Presence of earwax in the left ear, causing stuffiness. No pain or fullness reported. - Attempted earwax removal during visit. Incomplete removal. Patient tolerated well. Recommend trial of over the counter debrox drops.      Return in about 6 weeks (around 05/16/2024) for F/U with Khalil Szczepanik.    Jordan FORBES Pereyra, NP  "

## 2024-04-04 NOTE — Assessment & Plan Note (Signed)
 Gastroesophageal reflux disease Symptoms suggestive of GERD, possibly exacerbated by spicy food. Differential includes cardiac etiology, but EKG normal and low risk factors. Previous endoscopy showed no significant findings. Consideration of gallstones due to referred pain possibility. - Prescribed pantoprazole  40mg /day daily for 8 weeks. - Advised to avoid spicy foods, mint, caffeine, and chocolate. - Ordered abdominal ultrasound to evaluate for gallstones. - Ordered labs including complete blood cell count and pancreatic enzymes. - Instructed to go to ER if symptoms worsen, especially with exertion
